# Patient Record
Sex: Male | Born: 1935 | Race: White | Hispanic: No | Marital: Married | Smoking: Former smoker
Health system: Southern US, Community
[De-identification: ages and names within clinical notes are randomized; demographics above are authoritative.]

## PROBLEM LIST (undated history)

## (undated) DIAGNOSIS — K219 Gastro-esophageal reflux disease without esophagitis: Secondary | ICD-10-CM

## (undated) DIAGNOSIS — I10 Essential (primary) hypertension: Secondary | ICD-10-CM

## (undated) DIAGNOSIS — K2270 Barrett's esophagus without dysplasia: Secondary | ICD-10-CM

## (undated) DIAGNOSIS — G459 Transient cerebral ischemic attack, unspecified: Secondary | ICD-10-CM

## (undated) DIAGNOSIS — E7800 Pure hypercholesterolemia, unspecified: Secondary | ICD-10-CM

## (undated) DIAGNOSIS — R079 Chest pain, unspecified: Secondary | ICD-10-CM

## (undated) DIAGNOSIS — R0989 Other specified symptoms and signs involving the circulatory and respiratory systems: Secondary | ICD-10-CM

## (undated) DIAGNOSIS — I5189 Other ill-defined heart diseases: Secondary | ICD-10-CM

## (undated) DIAGNOSIS — E785 Hyperlipidemia, unspecified: Secondary | ICD-10-CM

## (undated) DIAGNOSIS — I480 Paroxysmal atrial fibrillation: Secondary | ICD-10-CM

## (undated) DIAGNOSIS — R001 Bradycardia, unspecified: Secondary | ICD-10-CM

## (undated) DIAGNOSIS — I350 Nonrheumatic aortic (valve) stenosis: Secondary | ICD-10-CM

## (undated) DIAGNOSIS — I441 Atrioventricular block, second degree: Secondary | ICD-10-CM

## (undated) DIAGNOSIS — R319 Hematuria, unspecified: Secondary | ICD-10-CM

## (undated) HISTORY — PX: APPENDECTOMY: SHX54

## (undated) HISTORY — DX: Bradycardia, unspecified: R00.1

## (undated) HISTORY — DX: Hyperlipidemia, unspecified: E78.5

## (undated) HISTORY — DX: Other specified symptoms and signs involving the circulatory and respiratory systems: R09.89

## (undated) HISTORY — DX: Transient cerebral ischemic attack, unspecified: G45.9

## (undated) HISTORY — DX: Nonrheumatic aortic (valve) stenosis: I35.0

## (undated) HISTORY — PX: TONSILLECTOMY: SUR1361

## (undated) HISTORY — PX: COLONOSCOPY: SHX174

## (undated) HISTORY — DX: Atrioventricular block, second degree: I44.1

## (undated) HISTORY — DX: Hematuria, unspecified: R31.9

## (undated) HISTORY — PX: ESOPHAGOGASTRODUODENOSCOPY: SHX1529

## (undated) HISTORY — DX: Chest pain, unspecified: R07.9

## (undated) HISTORY — DX: Paroxysmal atrial fibrillation: I48.0

## (undated) HISTORY — DX: Other ill-defined heart diseases: I51.89

---

## 2008-12-05 ENCOUNTER — Ambulatory Visit: Payer: Self-pay | Admitting: Gastroenterology

## 2009-10-05 ENCOUNTER — Ambulatory Visit: Payer: Self-pay | Admitting: Gastroenterology

## 2009-10-09 ENCOUNTER — Ambulatory Visit: Payer: Self-pay | Admitting: Gastroenterology

## 2009-12-25 ENCOUNTER — Ambulatory Visit: Payer: Self-pay | Admitting: Gastroenterology

## 2011-12-22 ENCOUNTER — Emergency Department (HOSPITAL_COMMUNITY)
Admission: EM | Admit: 2011-12-22 | Discharge: 2011-12-22 | Disposition: A | Discharge status: Home / Self Care | Payer: PRIVATE HEALTH INSURANCE | Attending: Emergency Medicine | Admitting: Emergency Medicine

## 2011-12-22 ENCOUNTER — Encounter (HOSPITAL_COMMUNITY): Payer: Self-pay | Admitting: Family Medicine

## 2011-12-22 ENCOUNTER — Emergency Department (HOSPITAL_COMMUNITY): Payer: PRIVATE HEALTH INSURANCE

## 2011-12-22 DIAGNOSIS — Z7982 Long term (current) use of aspirin: Secondary | ICD-10-CM | POA: Insufficient documentation

## 2011-12-22 DIAGNOSIS — I10 Essential (primary) hypertension: Secondary | ICD-10-CM | POA: Insufficient documentation

## 2011-12-22 DIAGNOSIS — R55 Syncope and collapse: Secondary | ICD-10-CM

## 2011-12-22 DIAGNOSIS — E7800 Pure hypercholesterolemia, unspecified: Secondary | ICD-10-CM | POA: Insufficient documentation

## 2011-12-22 HISTORY — DX: Pure hypercholesterolemia, unspecified: E78.00

## 2011-12-22 HISTORY — DX: Essential (primary) hypertension: I10

## 2011-12-22 HISTORY — DX: Barrett's esophagus without dysplasia: K22.70

## 2011-12-22 LAB — BASIC METABOLIC PANEL
BUN: 18 mg/dL (ref 6–23)
Calcium: 9.9 mg/dL (ref 8.4–10.5)
Chloride: 99 mEq/L (ref 96–112)
Creatinine, Ser: 0.84 mg/dL (ref 0.50–1.35)
GFR calc Af Amer: >90 mL/min (ref >90)

## 2011-12-22 LAB — CBC WITH DIFFERENTIAL/PLATELET
Basophils Absolute: 0 10*3/uL (ref 0.0–0.1)
Basophils Relative: 0 % (ref 0–1)
Eosinophils Absolute: 0 10*3/uL (ref 0.0–0.7)
Eosinophils Relative: 0 % (ref 0–5)
HCT: 38.7 % — ABNORMAL LOW (ref 39.0–52.0)
Hemoglobin: 13.5 g/dL (ref 13.0–17.0)
Lymphocytes Relative: 8 % — ABNORMAL LOW (ref 12–46)
Lymphs Abs: 0.7 K/uL (ref 0.7–4.0)
MCH: 33.1 pg (ref 26.0–34.0)
MCHC: 34.9 g/dL (ref 30.0–36.0)
MCV: 94.9 fL (ref 78.0–100.0)
Monocytes Absolute: 0.5 10*3/uL (ref 0.1–1.0)
Monocytes Relative: 5 % (ref 3–12)
Neutro Abs: 7.4 10*3/uL (ref 1.7–7.7)
Neutrophils Relative %: 87 % — ABNORMAL HIGH (ref 43–77)
Platelets: 153 K/uL (ref 150–400)
RBC: 4.08 MIL/uL — ABNORMAL LOW (ref 4.22–5.81)
RDW: 12.7 % (ref 11.5–15.5)
WBC: 8.5 K/uL (ref 4.0–10.5)

## 2011-12-22 LAB — TROPONIN I: Troponin I: <0.3 ng/mL (ref <0.30)

## 2011-12-22 LAB — BASIC METABOLIC PANEL WITH GFR
CO2: 27 meq/L (ref 19–32)
GFR calc non Af Amer: 81 mL/min — ABNORMAL LOW (ref >90)
Glucose, Bld: 103 mg/dL — ABNORMAL HIGH (ref 70–99)
Potassium: 4.1 meq/L (ref 3.5–5.1)
Sodium: 135 meq/L (ref 135–145)

## 2011-12-22 MED ORDER — SODIUM CHLORIDE 0.9 % IV BOLUS (SEPSIS)
500.0000 mL | Freq: Once | INTRAVENOUS | Status: AC
  Administered 2011-12-22: 500 mL via INTRAVENOUS

## 2011-12-22 NOTE — ED Provider Notes (Signed)
History     CSN: [id number]  Arrival date & time 12/22/11  1246   First MD Initiated Contact with Patient 12/22/11 1259      Chief Complaint  Patient presents with  . Loss of Consciousness    (Consider location/radiation/quality/duration/timing/severity/associated sxs/prior treatment) HPI Comments: Pt was at church, had given a speech, admits stressors at home over the past few weeks, not sleeping well, didn't sleep till 0200 last night and got up early to get ready for duties at church.  During end of service, he stood, got lightheaded, warmth sensation in chest, started to feel wobbly in legs.  He then realized someone was shaking his shoulder and recalls people saying that they couldn't feel a pulse, they were going to use AED, but never occurred.  Spouse reports he collapsed, was half way on bench, was lowerd to ground, a Emergency planning/management officer there did actually perform chest compressions.  Pt admits some chest soreness now to left ribcage.  He feels slightly weak and slightly anxious over what occurred, but otherwise no back pain, no CP prior to event, no abd pain, did have 1 N/V en route which he attributes to vertigo like sensation while driving. No recent black stools, no N/V/D.  No cold symptoms, cough.  No new meds.  He developed allergies of some kind, had near anaphylaxis several months ago, had allergy testing, was put on zyrtec for it.  He reports he gets some transient waves of nausea and body tingling and numbness which resolves since this event occurred.    Patient is a 78 y.o. male presenting with syncope. The history is provided by the patient, the spouse and the EMS personnel.  Loss of Consciousness Pertinent negatives include no chest pain.    Past Medical History  Diagnosis Date  . Hypertension   . High cholesterol   . Barrett esophagus     History reviewed. No pertinent past surgical history.  History reviewed. No pertinent family history.  History  Substance Use  Topics  . Smoking status: Never Smoker   . Smokeless tobacco: Not on file  . Alcohol Use: No      Review of Systems  Constitutional: Negative for fever, chills and diaphoresis.  Cardiovascular: Positive for syncope. Negative for chest pain, palpitations and leg swelling.  Gastrointestinal: Positive for nausea and vomiting.  Genitourinary: Negative for flank pain.  Musculoskeletal: Negative for back pain.  Skin: Positive for color change. Negative for rash.  Neurological: Positive for dizziness, syncope, weakness, light-headedness and numbness.  All other systems reviewed and are negative.    Allergies  Review of patient's allergies indicates no known allergies.  Home Medications   Current Outpatient Rx  Name Route Sig Dispense Refill  . AMLODIPINE-ATORVASTATIN 5-20 MG PO TABS Oral Take 1 tablet by mouth daily.    . ASPIRIN EC 81 MG PO TBEC Oral Take 81 mg by mouth daily.    Marland Kitchen CETIRIZINE HCL 10 MG PO TABS Oral Take 10 mg by mouth daily.    Marland Kitchen LISINOPRIL-HYDROCHLOROTHIAZIDE 20-12.5 MG PO TABS Oral Take 1 tablet by mouth daily.    Marland Kitchen OMEPRAZOLE 20 MG PO CPDR Oral Take 20 mg by mouth daily.      BP 154/62  Pulse 65  Temp 97.5 F (36.4 C) (Oral)  Resp 20  SpO2 100%  Physical Exam  Nursing note and vitals reviewed. Constitutional: He is oriented to person, place, and time. He appears well-developed and well-nourished. No distress.  HENT:  Head: Normocephalic  and atraumatic.  Neck: Normal range of motion. Neck supple.  Cardiovascular: Normal rate.   No murmur heard. Pulmonary/Chest: Effort normal and breath sounds normal. No respiratory distress. He has no wheezes.  Abdominal: Soft. He exhibits no distension and no mass. There is no tenderness. There is no guarding.  Musculoskeletal: He exhibits no edema and no tenderness.  Neurological: He is alert and oriented to person, place, and time. Coordination normal.  Skin: Skin is warm and dry. No rash noted. He is not  diaphoretic. No pallor.  Psychiatric: He has a normal mood and affect.    ED Course  Procedures (including critical care time)  Labs Reviewed  CBC WITH DIFFERENTIAL - Abnormal; Notable for the following:    RBC 4.08 (*)     HCT 38.7 (*)     Neutrophils Relative 87 (*)     Lymphocytes Relative 8 (*)     All other components within normal limits  BASIC METABOLIC PANEL - Abnormal; Notable for the following:    Glucose, Bld 103 (*)     GFR calc non Af Amer 81 (*)     All other components within normal limits  TROPONIN I   Dg Chest 2 View  12/22/2011  *RADIOLOGY REPORT*  Clinical Data: Syncopal episode.  CHEST - 2 VIEW  Comparison: None.  Findings: The lungs are clear without edema, infiltrate, nodule or pleural effusion.  Heart size is normal.  Bony thorax shows mild and diffuse spondylosis of the thoracic spine as well as associated scoliosis and osteopenia.  IMPRESSION: No active disease.  Original Report Authenticated By: Reola Calkins, M.D.     1. Syncope     RA sat is 100% and I interpret to be normal.  4:17 PM Pt continues to feel well, no fracture of ribs seen on plain 2 view chest.  Pt is offered admission, discussed at length all issues.  He declines.  If he can ambulate, will d/c home and he can follow up with PCP in [place] at [facility].    MDM  Pt with syncope after brief prodrome of feeling light headed, but no CP, back pain.  Had 1 episode of vomiting.  Pt actually had CPR at scene, but unclear if he truly needed or not, I suspect not as he recalls hearing people talking about him at the scene.  He is almost entirely back to baseline here, normal neuro exam, ECG shows no arrythmia or ischemia, normal labs, troponin.          Gavin Pound. Oletta Lamas, MD 12/23/11 2303

## 2011-12-22 NOTE — ED Notes (Signed)
Per EMS, pt was at church and had a syncopal episode. No heart hx but pt showing 1st degree AV block. 1 episode of vomiting in route. Per bystanders When pt had syncopal episode initially he was pulseless. CPR initiated and pt became responsive. 324 ASA and 4mg  zofran given in route. IV left FA

## 2011-12-22 NOTE — Discharge Instructions (Signed)
 Syncope You have had a fainting (syncopal) spell. A fainting episode is a sudden, short-lived loss of consciousness. It results in complete recovery. It occurs because there has been a temporary shortage of oxygen and/or sugar (glucose) to the brain. CAUSES   Blood pressure pills and other medications that may lower blood pressure below normal. Sudden changes in posture (sudden standing).   Over-medication. Take your medications as directed.   Standing too long. This can cause blood to pool in the legs.   Seizure disorders.   Low blood sugar (hypoglycemia) of diabetes. This more commonly causes coma.   Bearing down to go to the bathroom. This can cause your blood pressure to rise suddenly. Your body compensates by making the blood pressure too low when you stop bearing down.   Hardening of the arteries where the brain temporarily does not receive enough blood.   Irregular heart beat and circulatory problems.   Fear, emotional distress, injury, sight of blood, or illness.  Your caregiver will send you home if the syncope was from non-worrisome causes (benign). Depending on your age and health, you may stay to be monitored and observed. If you return home, have someone stay with you if your caregiver feels that is desirable. It is very important to keep all follow-up referrals and appointments in order to properly manage this condition. This is a serious problem which can lead to serious illness and death if not carefully managed.  WARNING: Do not drive or operate machinery until your caregiver feels that it is safe for you to do so. SEEK IMMEDIATE MEDICAL CARE IF:   You have another fainting episode or faint while lying or sitting down. DO NOT DRIVE YOURSELF. Call 911 if no other help is available.   You have chest pain, are feeling sick to your stomach (nausea), vomiting or abdominal pain.   You have an irregular heartbeat or one that is very fast (pulse over 120 beats per minute).    You have a loss of feeling in some part of your body or lose movement in your arms or legs.   You have difficulty with speech, confusion, severe weakness, or visual problems.   You become sweaty and/or feel light headed.  Make sure you are rechecked as instructed. Document Released: 05/27/2005 Document Revised: 05/16/2011 Document Reviewed: 01/15/2007 [facility] Patient Information 2012 [place], [place].

## 2011-12-22 NOTE — ED Notes (Signed)
Pt ambulated in hall and tolerated fluids well.

## 2012-03-24 ENCOUNTER — Ambulatory Visit: Payer: Self-pay | Admitting: Gastroenterology

## 2013-11-23 DIAGNOSIS — K2270 Barrett's esophagus without dysplasia: Secondary | ICD-10-CM | POA: Diagnosis present

## 2013-12-07 ENCOUNTER — Ambulatory Visit: Payer: Self-pay | Admitting: Gastroenterology

## 2013-12-08 LAB — PATHOLOGY REPORT

## 2013-12-14 ENCOUNTER — Ambulatory Visit: Payer: Self-pay | Admitting: Gastroenterology

## 2013-12-17 LAB — PATHOLOGY REPORT

## 2014-04-05 DIAGNOSIS — I10 Essential (primary) hypertension: Secondary | ICD-10-CM | POA: Diagnosis present

## 2014-04-05 DIAGNOSIS — E7800 Pure hypercholesterolemia, unspecified: Secondary | ICD-10-CM | POA: Insufficient documentation

## 2017-07-17 ENCOUNTER — Ambulatory Visit: Payer: Medicare Other | Admitting: Anesthesiology

## 2017-07-17 ENCOUNTER — Encounter: Payer: Self-pay | Admitting: *Deleted

## 2017-07-17 ENCOUNTER — Encounter
Admission: RE | Disposition: A | Discharge status: Home / Self Care | Payer: Self-pay | Source: Ambulatory Visit | Attending: Gastroenterology

## 2017-07-17 ENCOUNTER — Ambulatory Visit
Admission: RE | Admit: 2017-07-17 | Discharge: 2017-07-17 | Disposition: A | Discharge status: Home / Self Care | Payer: Medicare Other | Source: Ambulatory Visit | Attending: Gastroenterology | Admitting: Gastroenterology

## 2017-07-17 DIAGNOSIS — K449 Diaphragmatic hernia without obstruction or gangrene: Secondary | ICD-10-CM | POA: Insufficient documentation

## 2017-07-17 DIAGNOSIS — Z7982 Long term (current) use of aspirin: Secondary | ICD-10-CM | POA: Insufficient documentation

## 2017-07-17 DIAGNOSIS — K2960 Other gastritis without bleeding: Secondary | ICD-10-CM | POA: Diagnosis not present

## 2017-07-17 DIAGNOSIS — K2270 Barrett's esophagus without dysplasia: Secondary | ICD-10-CM | POA: Insufficient documentation

## 2017-07-17 DIAGNOSIS — I10 Essential (primary) hypertension: Secondary | ICD-10-CM | POA: Diagnosis not present

## 2017-07-17 DIAGNOSIS — Z79899 Other long term (current) drug therapy: Secondary | ICD-10-CM | POA: Diagnosis not present

## 2017-07-17 DIAGNOSIS — E7800 Pure hypercholesterolemia, unspecified: Secondary | ICD-10-CM | POA: Insufficient documentation

## 2017-07-17 DIAGNOSIS — K219 Gastro-esophageal reflux disease without esophagitis: Secondary | ICD-10-CM | POA: Insufficient documentation

## 2017-07-17 HISTORY — PX: ESOPHAGOGASTRODUODENOSCOPY (EGD) WITH PROPOFOL: SHX5813

## 2017-07-17 HISTORY — DX: Gastro-esophageal reflux disease without esophagitis: K21.9

## 2017-07-17 SURGERY — ESOPHAGOGASTRODUODENOSCOPY (EGD) WITH PROPOFOL
Anesthesia: General

## 2017-07-17 MED ORDER — FENTANYL CITRATE (PF) 100 MCG/2ML IJ SOLN
INTRAMUSCULAR | Status: DC | PRN
  Administered 2017-07-17: 50 ug via INTRAVENOUS

## 2017-07-17 MED ORDER — FENTANYL CITRATE (PF) 100 MCG/2ML IJ SOLN
INTRAMUSCULAR | Status: AC
  Filled 2017-07-17: qty 2

## 2017-07-17 MED ORDER — LIDOCAINE HCL (PF) 2 % IJ SOLN
INTRAMUSCULAR | Status: AC
  Filled 2017-07-17: qty 10

## 2017-07-17 MED ORDER — PROPOFOL 10 MG/ML IV BOLUS
INTRAVENOUS | Status: AC
  Filled 2017-07-17: qty 20

## 2017-07-17 MED ORDER — PROPOFOL 10 MG/ML IV BOLUS
INTRAVENOUS | Status: DC | PRN
  Administered 2017-07-17: 30 mg via INTRAVENOUS
  Administered 2017-07-17: 70 mg via INTRAVENOUS

## 2017-07-17 MED ORDER — SODIUM CHLORIDE 0.9 % IV SOLN: INTRAVENOUS | Status: DC

## 2017-07-17 MED ORDER — MIDAZOLAM HCL 5 MG/5ML IJ SOLN
INTRAMUSCULAR | Status: DC | PRN
  Administered 2017-07-17: 1 mg via INTRAVENOUS

## 2017-07-17 MED ORDER — MIDAZOLAM HCL 2 MG/2ML IJ SOLN
INTRAMUSCULAR | Status: AC
  Filled 2017-07-17: qty 2

## 2017-07-17 MED ORDER — SODIUM CHLORIDE 0.9 % IV SOLN
INTRAVENOUS | Status: DC
  Administered 2017-07-17: 09:00:00 via INTRAVENOUS

## 2017-07-17 NOTE — Op Note (Signed)
[facility] Gastroenterology Patient Name: Carlos LericheClarence Frey Procedure Date: 07/17/2017 9:20 AM MRN: [id number] Account #: [id number] Date of Birth: Sep 01, 1929 Admit Type: Outpatient Age: 84 [facility] Gender: Male Note Status: Finalized Procedure:            Upper GI endoscopy Indications:          Surveillance procedure, Follow-up of Barrett's esophagus Providers:            Christena DeemMartin U. Skulskie, MD Referring MD:         Hassell HalimMarcus E. Babaoff MD (Referring MD) Medicines:            Monitored Anesthesia Care Complications:        No immediate complications. Procedure:            Pre-Anesthesia Assessment:                       - ASA Grade Assessment: II - A patient with mild                        systemic disease.                       After obtaining informed consent, the endoscope was                        passed under direct vision. Throughout the procedure,                        the patient's blood pressure, pulse, and oxygen                        saturations were monitored continuously. The Endoscope                        was introduced through the mouth, and advanced to the                        third part of duodenum. The upper GI endoscopy was                        accomplished without difficulty. The patient tolerated                        the procedure well. Findings:      Mucosal A-ring, intermittant, at the GE junction, widely patent.      Minimal evidence of Barretts esophagus present at the gastroesophageal       junction. The maximum longitudinal extent of these mucosal changes was       minimal evidence of Barretts endoscopically.      The exam of the esophagus was otherwise normal. No evidence of       esophagitis.      Diffuse mild inflammation characterized by erythema was found in the       gastric body. Biopsies were taken with a cold forceps for histology.      The cardia and gastric fundus were normal on retroflexion  otherwise.      A small hiatal hernia was present.      The examined duodenum was normal. Impression:           - Esophageal mucosal changes secondary to  established                        short-segment Barrett's disease.                       - Gastritis. Biopsied.                       - Small hiatal hernia.                       - Normal examined duodenum. Recommendation:       - Discharge patient to home.                       - Continue present medications.                       - Telephone GI clinic for pathology results in 1 week. Procedure Code(s):    --- Professional ---                       [id number], Esophagogastroduodenoscopy, flexible, transoral;                        with biopsy, single or multiple Diagnosis Code(s):    --- Professional ---                       K22.70, Barrett's esophagus without dysplasia                       K29.70, Gastritis, unspecified, without bleeding                       K44.9, Diaphragmatic hernia without obstruction or                        gangrene CPT copyright 2016 [employer]. All rights reserved. The codes documented in this report are preliminary and upon coder review may  be revised to meet current compliance requirements. Christena Deem, MD 07/17/2017 9:53:00 AM This report has been signed electronically. Number of Addenda: 0 Note Initiated On: 07/17/2017 9:20 AM      [facility]

## 2017-07-17 NOTE — Anesthesia Preprocedure Evaluation (Signed)
Anesthesia Evaluation  Patient identified by MRN, date of birth, ID band Patient awake    Reviewed: Allergy & Precautions, NPO status , Patient's Chart, lab work & pertinent test results  History of Anesthesia Complications Negative for: history of anesthetic complications  Airway Mallampati: II       Dental   Pulmonary neg sleep apnea, neg COPD, former smoker,           Cardiovascular hypertension, Pt. on medications (-) Past MI and (-) CHF (-) dysrhythmias + Valvular Problems/Murmurs (murmur, no tx)      Neuro/Psych neg Seizures    GI/Hepatic Neg liver ROS, GERD  Medicated and Controlled,  Endo/Other  neg diabetes  Renal/GU negative Renal ROS     Musculoskeletal   Abdominal   Peds  Hematology   Anesthesia Other Findings   Reproductive/Obstetrics                             Anesthesia Physical Anesthesia Plan  ASA: II  Anesthesia Plan: General   Post-op Pain Management:    Induction: Intravenous  PONV Risk Score and Plan: 2 and TIVA and Propofol infusion  Airway Management Planned: Nasal Cannula  Additional Equipment:   Intra-op Plan:   Post-operative Plan:   Informed Consent: I have reviewed the patients History and Physical, chart, labs and discussed the procedure including the risks, benefits and alternatives for the proposed anesthesia with the patient or authorized representative who has indicated his/her understanding and acceptance.     Plan Discussed with:   Anesthesia Plan Comments:         Anesthesia Quick Evaluation

## 2017-07-17 NOTE — Transfer of Care (Signed)
Immediate Anesthesia Transfer of Care Note  Patient: Carlos RoysClarence E Stall  Procedure(s) Performed: ESOPHAGOGASTRODUODENOSCOPY (EGD) WITH PROPOFOL (N/A )  Patient Location: PACU and Endoscopy Unit  Anesthesia Type:General  Level of Consciousness: sedated  Airway & Oxygen Therapy: Patient Spontanous Breathing  Post-op Assessment: Report given to RN and Post -op Vital signs reviewed and stable  Post vital signs: Reviewed and stable  Last Vitals:  Vitals:   07/17/17 0858  BP: 132/60  Pulse: 64  Resp: 18  Temp: (!) 36 C  SpO2: 98%    Last Pain:  Vitals:   07/17/17 0858  TempSrc: Tympanic         Complications: No apparent anesthesia complications

## 2017-07-17 NOTE — Anesthesia Postprocedure Evaluation (Signed)
Anesthesia Post Note  Patient: Carlos Frey  Procedure(s) Performed: ESOPHAGOGASTRODUODENOSCOPY (EGD) WITH PROPOFOL (N/A )  Patient location during evaluation: Endoscopy Anesthesia Type: General Level of consciousness: awake and alert Pain management: pain level controlled Vital Signs Assessment: post-procedure vital signs reviewed and stable Respiratory status: spontaneous breathing and respiratory function stable Cardiovascular status: stable Anesthetic complications: no     Last Vitals:  Vitals:   07/17/17 0858 07/17/17 0945  BP: 132/60 (!) 112/43  Pulse: 64 (!) 52  Resp: 18 19  Temp: (!) 36 C (!) 36.1 C  SpO2: 98% 96%    Last Pain:  Vitals:   07/17/17 0945  TempSrc: Tympanic                 KEPHART,WILLIAM K

## 2017-07-17 NOTE — Anesthesia Post-op Follow-up Note (Signed)
Anesthesia QCDR form completed.        

## 2017-07-17 NOTE — H&P (Addendum)
Outpatient short stay form Pre-procedure 07/17/2017 9:04 AM Carlos DeemMartin U Kaelee Pfeffer MD  Primary Physician: Dr Kandyce RudMarcus Babaoff  Reason for visit: EGD  History of present illness: Patient is a 84 year old male presenting today as above.  He has personal history of Barrett's esophagus without dysplasia.  He is taking a proton pump inhibitor daily.  He has no problems with heartburn or dysphagia.  He takes 81 mg aspirin daily.  He takes no other aspirin products or blood thinning agents.    Current Facility-Administered Medications:  .  0.9 %  sodium chloride infusion, , Intravenous, Continuous, Carlos DeemSkulskie, Barrie Sigmund U, MD  Medications Prior to Admission  Medication Sig Dispense Refill Last Dose  . amLODipine-atorvastatin (CADUET) 5-20 MG per tablet Take 1 tablet by mouth daily.   07/16/2017 at 2100  . aspirin EC 81 MG tablet Take 81 mg by mouth daily.   07/12/2017  . cetirizine (ZYRTEC) 10 MG tablet Take 10 mg by mouth daily.   07/16/2017 at 9:00  . lisinopril-hydrochlorothiazide (PRINZIDE,ZESTORETIC) 20-12.5 MG per tablet Take 1 tablet by mouth daily.   07/16/2017 at 0900  . omeprazole (PRILOSEC) 20 MG capsule Take 20 mg by mouth daily.   07/16/2017 at Unknown time     No Known Allergies   Past Medical History:  Diagnosis Date  . Barrett esophagus   . GERD (gastroesophageal reflux disease)   . High cholesterol   . Hypertension     Review of systems:      Physical Exam    Heart and lungs: Regular rate and rhythm without rub or gallop, there is a marked aortic murmur.  Bilaterally clear to auscultation    HEENT: Normocephalic atraumatic eyes are anicteric    Other:    Pertinant exam for procedure: Soft nontender nondistended bowel sounds positive normoactive. I have discussed the risks benefits and complications of procedures to include not limited to bleeding, infection, perforation and the risk of sedation and the patient wishes to proceed.    Planned proceedures: EGD and indicated  procedures. I have discussed the risks benefits and complications of procedures to include not limited to bleeding, infection, perforation and the risk of sedation and the patient wishes to proceed.    Carlos DeemMartin U Gavyn Ybarra, MD Gastroenterology 07/17/2017  9:04 AM   Addendum: Carlos Frey's discussion with patient and anesthesia in regards to the aortic murmur.  We will need to arrange for him to have a echocardiogram for further evaluation at least baseline evaluation, to help determine future risk.

## 2017-07-18 LAB — SURGICAL PATHOLOGY

## 2018-04-05 ENCOUNTER — Other Ambulatory Visit: Payer: Self-pay

## 2018-04-05 ENCOUNTER — Observation Stay
Admission: EM | Admit: 2018-04-05 | Discharge: 2018-04-07 | Disposition: A | Discharge status: Home / Self Care | Payer: Medicare Other | Attending: Internal Medicine | Admitting: Internal Medicine

## 2018-04-05 DIAGNOSIS — E876 Hypokalemia: Secondary | ICD-10-CM | POA: Insufficient documentation

## 2018-04-05 DIAGNOSIS — I440 Atrioventricular block, first degree: Secondary | ICD-10-CM

## 2018-04-05 DIAGNOSIS — Z7982 Long term (current) use of aspirin: Secondary | ICD-10-CM | POA: Insufficient documentation

## 2018-04-05 DIAGNOSIS — R112 Nausea with vomiting, unspecified: Secondary | ICD-10-CM | POA: Insufficient documentation

## 2018-04-05 DIAGNOSIS — Z87891 Personal history of nicotine dependence: Secondary | ICD-10-CM | POA: Insufficient documentation

## 2018-04-05 DIAGNOSIS — Z79899 Other long term (current) drug therapy: Secondary | ICD-10-CM | POA: Insufficient documentation

## 2018-04-05 DIAGNOSIS — I441 Atrioventricular block, second degree: Secondary | ICD-10-CM | POA: Diagnosis not present

## 2018-04-05 DIAGNOSIS — E7800 Pure hypercholesterolemia, unspecified: Secondary | ICD-10-CM | POA: Diagnosis not present

## 2018-04-05 DIAGNOSIS — K219 Gastro-esophageal reflux disease without esophagitis: Secondary | ICD-10-CM | POA: Diagnosis not present

## 2018-04-05 DIAGNOSIS — K2270 Barrett's esophagus without dysplasia: Secondary | ICD-10-CM | POA: Diagnosis not present

## 2018-04-05 DIAGNOSIS — R42 Dizziness and giddiness: Secondary | ICD-10-CM | POA: Insufficient documentation

## 2018-04-05 DIAGNOSIS — I10 Essential (primary) hypertension: Secondary | ICD-10-CM | POA: Diagnosis not present

## 2018-04-05 DIAGNOSIS — R55 Syncope and collapse: Principal | ICD-10-CM | POA: Diagnosis present

## 2018-04-05 DIAGNOSIS — E785 Hyperlipidemia, unspecified: Secondary | ICD-10-CM | POA: Insufficient documentation

## 2018-04-05 LAB — TROPONIN I: Troponin I: <0.03 ng/mL (ref <0.03)

## 2018-04-05 LAB — CBC WITH DIFFERENTIAL/PLATELET
ABS IMMATURE GRANULOCYTES: 0.02 10*3/uL (ref 0.00–0.07)
Basophils Absolute: 0 10*3/uL (ref 0.0–0.1)
Basophils Relative: 0 %
Eosinophils Absolute: 0.1 10*3/uL (ref 0.0–0.5)
Eosinophils Relative: 1 %
HCT: 39.4 % (ref 39.0–52.0)
HEMOGLOBIN: 13.3 g/dL (ref 13.0–17.0)
IMMATURE GRANULOCYTES: 0 %
LYMPHS PCT: 23 %
Lymphs Abs: 1.1 10*3/uL (ref 0.7–4.0)
MCH: 32.8 pg (ref 26.0–34.0)
MCHC: 33.8 g/dL (ref 30.0–36.0)
MCV: 97.3 fL (ref 80.0–100.0)
MONO ABS: 0.4 10*3/uL (ref 0.1–1.0)
MONOS PCT: 9 %
NEUTROS ABS: 3.1 10*3/uL (ref 1.7–7.7)
Neutrophils Relative %: 67 %
Platelets: 185 10*3/uL (ref 150–400)
RBC: 4.05 MIL/uL — ABNORMAL LOW (ref 4.22–5.81)
RDW: 13.2 % (ref 11.5–15.5)
WBC: 4.8 10*3/uL (ref 4.0–10.5)
nRBC: 0 % (ref 0.0–0.2)

## 2018-04-05 LAB — COMPREHENSIVE METABOLIC PANEL
ALK PHOS: 73 U/L (ref 38–126)
ALT: 16 U/L (ref 0–44)
AST: 27 U/L (ref 15–41)
Albumin: 3.8 g/dL (ref 3.5–5.0)
Anion gap: 14 (ref 5–15)
BUN: 14 mg/dL (ref 8–23)
CALCIUM: 9.2 mg/dL (ref 8.9–10.3)
CO2: 23 mmol/L (ref 22–32)
CREATININE: 0.83 mg/dL (ref 0.61–1.24)
Chloride: 102 mmol/L (ref 98–111)
GFR calc non Af Amer: >60 mL/min (ref >60)
Glucose, Bld: 166 mg/dL — ABNORMAL HIGH (ref 70–99)
Potassium: 3.6 mmol/L (ref 3.5–5.1)
SODIUM: 139 mmol/L (ref 135–145)
Total Bilirubin: 1 mg/dL (ref 0.3–1.2)
Total Protein: 7.5 g/dL (ref 6.5–8.1)

## 2018-04-05 LAB — LACTIC ACID, PLASMA
LACTIC ACID, VENOUS: 2.3 mmol/L — AB (ref 0.5–1.9)
LACTIC ACID, VENOUS: 2.3 mmol/L — AB (ref 0.5–1.9)
Lactic Acid, Venous: 2.1 mmol/L (ref 0.5–1.9)

## 2018-04-05 LAB — LIPASE, BLOOD: Lipase: 25 U/L (ref 11–51)

## 2018-04-05 LAB — MAGNESIUM: MAGNESIUM: 1.8 mg/dL (ref 1.7–2.4)

## 2018-04-05 MED ORDER — POLYETHYLENE GLYCOL 3350 17 G PO PACK: 17.0000 g | PACK | Freq: Every day | ORAL | Status: DC | PRN

## 2018-04-05 MED ORDER — SODIUM CHLORIDE 0.9 % IV BOLUS
500.0000 mL | Freq: Once | INTRAVENOUS | Status: AC
  Administered 2018-04-05: 500 mL via INTRAVENOUS

## 2018-04-05 MED ORDER — ACETAMINOPHEN 650 MG RE SUPP: 650.0000 mg | Freq: Four times a day (QID) | RECTAL | Status: DC | PRN

## 2018-04-05 MED ORDER — MONTELUKAST SODIUM 10 MG PO TABS
10.0000 mg | ORAL_TABLET | Freq: Every day | ORAL | Status: DC
  Administered 2018-04-05: 10 mg via ORAL
  Filled 2018-04-05: qty 1

## 2018-04-05 MED ORDER — MECLIZINE HCL 25 MG PO TABS
25.0000 mg | ORAL_TABLET | Freq: Once | ORAL | Status: AC
  Administered 2018-04-05: 25 mg via ORAL
  Filled 2018-04-05: qty 1

## 2018-04-05 MED ORDER — ENOXAPARIN SODIUM 40 MG/0.4ML ~~LOC~~ SOLN
40.0000 mg | SUBCUTANEOUS | Status: DC
  Administered 2018-04-05 – 2018-04-06 (×2): 40 mg via SUBCUTANEOUS
  Filled 2018-04-05 (×2): qty 0.4

## 2018-04-05 MED ORDER — FLUTICASONE PROPIONATE 50 MCG/ACT NA SUSP
2.0000 | Freq: Every day | NASAL | Status: DC
  Administered 2018-04-06: 2 via NASAL
  Filled 2018-04-05: qty 16

## 2018-04-05 MED ORDER — PANTOPRAZOLE SODIUM 40 MG PO TBEC
40.0000 mg | DELAYED_RELEASE_TABLET | Freq: Every day | ORAL | Status: DC
  Administered 2018-04-06 – 2018-04-07 (×2): 40 mg via ORAL
  Filled 2018-04-05 (×2): qty 1

## 2018-04-05 MED ORDER — ONDANSETRON HCL 4 MG/2ML IJ SOLN
4.0000 mg | Freq: Once | INTRAMUSCULAR | Status: AC
  Administered 2018-04-05: 4 mg via INTRAVENOUS
  Filled 2018-04-05: qty 2

## 2018-04-05 MED ORDER — LISINOPRIL 20 MG PO TABS
20.0000 mg | ORAL_TABLET | Freq: Every day | ORAL | Status: DC
  Administered 2018-04-05 – 2018-04-07 (×3): 20 mg via ORAL
  Filled 2018-04-05: qty 2
  Filled 2018-04-05 (×2): qty 1

## 2018-04-05 MED ORDER — AMLODIPINE BESYLATE 5 MG PO TABS
5.0000 mg | ORAL_TABLET | Freq: Every day | ORAL | Status: DC
  Administered 2018-04-06 – 2018-04-07 (×2): 5 mg via ORAL
  Filled 2018-04-05 (×2): qty 1

## 2018-04-05 MED ORDER — ASPIRIN EC 81 MG PO TBEC
81.0000 mg | DELAYED_RELEASE_TABLET | Freq: Every day | ORAL | Status: DC
  Administered 2018-04-06 – 2018-04-07 (×2): 81 mg via ORAL
  Filled 2018-04-05 (×2): qty 1

## 2018-04-05 MED ORDER — ATORVASTATIN CALCIUM 20 MG PO TABS
20.0000 mg | ORAL_TABLET | Freq: Every day | ORAL | Status: DC
  Administered 2018-04-06 – 2018-04-07 (×2): 20 mg via ORAL
  Filled 2018-04-05 (×2): qty 1

## 2018-04-05 MED ORDER — ONDANSETRON HCL 4 MG PO TABS: 4.0000 mg | ORAL_TABLET | Freq: Four times a day (QID) | ORAL | Status: DC | PRN

## 2018-04-05 MED ORDER — ONDANSETRON HCL 4 MG/2ML IJ SOLN
4.0000 mg | Freq: Four times a day (QID) | INTRAMUSCULAR | Status: DC | PRN
  Administered 2018-04-05: 4 mg via INTRAVENOUS

## 2018-04-05 MED ORDER — ACETAMINOPHEN 325 MG PO TABS: 650.0000 mg | ORAL_TABLET | Freq: Four times a day (QID) | ORAL | Status: DC | PRN

## 2018-04-05 MED ORDER — AMLODIPINE-ATORVASTATIN 5-20 MG PO TABS: 1.0000 | ORAL_TABLET | Freq: Every day | ORAL | Status: DC

## 2018-04-05 MED ORDER — LORATADINE 10 MG PO TABS
10.0000 mg | ORAL_TABLET | Freq: Every day | ORAL | Status: DC
  Administered 2018-04-06 – 2018-04-07 (×2): 10 mg via ORAL
  Filled 2018-04-05 (×2): qty 1

## 2018-04-05 NOTE — ED Notes (Signed)
Report to Susan, RN  

## 2018-04-05 NOTE — ED Provider Notes (Signed)
[facility] Emergency Department Provider Note ____________________________________________   First MD Initiated Contact with Patient 04/05/18 1111     (approximate)  I have reviewed the triage vital signs and the nursing notes.   HISTORY  Chief Complaint Dizziness and Emesis    HPI Carlos Frey is a 84 y.o. 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  The patient states that he still feels lightheaded and nauseous although when he puts his head down it is slightly better.  He denies any spinning component.  He denies chest pain, shortness of breath, or palpitations.  He reports one prior syncopal episode a few years ago but no episodes similar to today.  Past Medical History:  Diagnosis Date  . Barrett esophagus   . GERD (gastroesophageal reflux disease)   . High cholesterol   . Hypertension     There are no active problems to display for this patient.   Past Surgical History:  Procedure Laterality Date  . APPENDECTOMY    . COLONOSCOPY  08/08/03, 12/05/08 & 12/14/13  . ESOPHAGOGASTRODUODENOSCOPY  10/09/09, 12/25/09 & 03/24/12  . ESOPHAGOGASTRODUODENOSCOPY (EGD) WITH PROPOFOL N/A 07/17/2017   Procedure: ESOPHAGOGASTRODUODENOSCOPY (EGD) WITH PROPOFOL;  Surgeon: Christena Deem, MD;  Location: [facility] ENDOSCOPY;  Service: Endoscopy;  Laterality: N/A;  . TONSILLECTOMY      Prior to Admission medications   Medication Sig Start Date End Date Taking? Authorizing Provider  amLODipine-atorvastatin (CADUET) 5-20 MG per tablet Take 1 tablet by mouth daily.   Yes [provider]  aspirin EC 81 MG tablet Take 81 mg by mouth daily.   Yes [provider]  cetirizine (ZYRTEC) 10 MG tablet Take 10 mg by mouth daily.   Yes [provider]  fluticasone (FLONASE) 50 MCG/ACT  nasal spray Place 2 sprays into both nostrils daily. 04/03/18  Yes [provider]  lisinopril-hydrochlorothiazide (PRINZIDE,ZESTORETIC) 20-12.5 MG per tablet Take 1 tablet by mouth daily.   Yes [provider]  montelukast (SINGULAIR) 10 MG tablet Take 10 mg by mouth at bedtime. 04/03/18  Yes [provider]  omeprazole (PRILOSEC) 20 MG capsule Take 20 mg by mouth daily.   Yes [provider]    Allergies Patient has no known allergies.  No family history on file.  Social History Social History   Tobacco Use  . Smoking status: Former Smoker    Types: Pipe, Cigars    Last attempt to quit: 1965    Years since quitting: 54.8  . Smokeless tobacco: Never Used  Substance Use Topics  . Alcohol use: No  . Drug use: No    Review of Systems  Constitutional: No fever. Eyes: No visual changes. ENT: No sore throat. Cardiovascular: Denies chest pain. Respiratory: Denies shortness of breath. Gastrointestinal: Positive for nausea and vomiting.  Genitourinary: Negative for flank pain.  Musculoskeletal: Negative for back pain. Skin: Negative for rash. Neurological: Negative for headaches, focal weakness or numbness.   ____________________________________________   PHYSICAL EXAM:  VITAL SIGNS: ED Triage Vitals [04/05/18 1036]  Enc Vitals Group     BP (!) 171/58     Pulse Rate 71     Resp 18     Temp 98.1 F (36.7 C)     Temp Source Oral     SpO2 99 %     Weight  187 lb (84.8 kg)     Height 5\' 11"  (1.803 m)     Head Circumference      Peak Flow      Pain Score 0     Pain Loc      Pain Edu?      Excl. in GC?     Constitutional: Alert and oriented.  Slightly uncomfortable appearing but in no acute distress. Eyes: Conjunctivae are normal.  EOMI.  PERRLA.  No nystagmus. Head: Atraumatic. Nose: No congestion/rhinnorhea. Mouth/Throat: Mucous membranes are slightly dry.   Neck: Normal range of motion.  Cardiovascular: Normal rate,  irregular rhythm. Grossly normal heart sounds.  Good peripheral circulation. Respiratory: Normal respiratory effort.  No retractions. Lungs CTAB. Gastrointestinal: Soft and nontender. No distention.  Genitourinary: No flank tenderness. Musculoskeletal: No lower extremity edema.  Extremities warm and well perfused.  Neurologic:  Normal speech and language.  Normal coordination.  Motor intact in all extremities.  No gross focal neurologic deficits are appreciated.  Skin:  Skin is warm and dry. No rash noted. Psychiatric: Mood and affect are normal. Speech and behavior are normal.  ____________________________________________   LABS (all labs ordered are listed, but only abnormal results are displayed)  Labs Reviewed  CBC WITH DIFFERENTIAL/PLATELET - Abnormal; Notable for the following components:      Result Value   RBC 4.05 (*)    All other components within normal limits  COMPREHENSIVE METABOLIC PANEL - Abnormal; Notable for the following components:   Glucose, Bld 166 (*)    All other components within normal limits  TROPONIN I  LIPASE, BLOOD  LACTIC ACID, PLASMA  LACTIC ACID, PLASMA  URINALYSIS, COMPLETE (UACMP) WITH MICROSCOPIC   ____________________________________________  EKG  ED ECG REPORT I, Dionne Bucy, the attending physician, personally viewed and interpreted this ECG.  Date: 04/05/2018 EKG Time: 1022 Rate: 73 Rhythm: Sinus rhythm with prolonged PR and PACs QRS Axis: normal Intervals: normal ST/T Wave abnormalities: normal Narrative Interpretation: no evidence of acute ischemia  ____________________________________________  RADIOLOGY    ____________________________________________   PROCEDURES  Procedure(s) performed: No  Procedures  Critical Care performed: No ____________________________________________   INITIAL IMPRESSION / ASSESSMENT AND PLAN / ED COURSE  Pertinent labs & imaging results that were available during my care of the  patient were reviewed by me and considered in my medical decision making (see chart for details).  84 year old male with PMH as noted above but no significant prior cardiac history presents with an episode of near syncope with nausea and vomiting.  The dizziness is somewhat positional.  On exam his vital signs are normal except for hypertension.  The remainder of the exam is as described above.  Neuro exam is nonfocal.  The EKG shows significantly prolonged PR interval and what appeared to be PACs with pauses.  It does not appear to be a complete heart block and there are no dropped beats although he has had some longer sinus pauses on the monitor.  I have no prior EKG available for comparison.  Overall the patient's presentation may very well be a vasovagal near syncope especially as he states he was in a hurry this morning and did not eat, and then was standing for a prolonged prior.  Differential also includes peripheral vertigo since it is somewhat positional although I think this is less likely.  However given the abnormal EKG and the patient's age, it could also be a cardiac etiology.  We will obtain labs, give fluids and symptomatic medications, and  reassess.  I will discuss with cardiology but consider admission for further observation.  ----------------------------------------- 1:38 PM on 04/05/2018 -----------------------------------------  Lab work-up is unremarkable.  The patient is feeling somewhat better after nausea medication and fluids.  I consulted Dr. and from cardiology about the rhythm on the EKG.  He agrees to come evaluate the patient also agrees with admission for monitoring.  I signed the patient out to the hospitalist Dr. Allena Katz. ____________________________________________   FINAL CLINICAL IMPRESSION(S) / ED DIAGNOSES  Final diagnoses:  Near syncope  1st degree AV block      NEW MEDICATIONS STARTED DURING THIS VISIT:  New Prescriptions   No medications on file      Note:  This document was prepared using Dragon voice recognition software and may include unintentional dictation errors.    Dionne Bucy, MD 04/05/18 [phone]

## 2018-04-05 NOTE — Consult Note (Signed)
Cardiology Consultation:   Patient ID: Carlos Frey MRN: [id number]; DOB: Mar 01, 1930  Admit date: 04/05/2018 Date of Consult: 04/05/2018  Primary Care Provider: [name], MD Primary Cardiologist: New - Consult by Krisinda Giovanni Primary Electrophysiologist:  None    Patient Profile:   Carlos Frey is a 84 y.o. male with a hx of hypertension, GERD, and Barrett's esophagus who is being seen today for the evaluation of abnormal EKG and near syncope at the request of Dr. Marisa Severin.  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  He was standing and notes giving a particularly long lesson, during which she began to feel woozy and off balance.  He was able to sit down and ultimately placed his head between his legs for several minutes.  He felt very lightheaded but never passed out.  He subsequently became acutely nauseated and vomited.  He was able to transfer to a wheelchair and was brought by his family to the emergency department for further evaluation.  Here, he has continued to feel lightheaded and off-balance when standing.  He has not had any further nausea or vomiting.  Carlos Frey reports a history of a heart murmur but otherwise no cardiac disease.  He has not had any chest pain, shortness of breath, palpitations, orthopnea, PND, or edema.  He is typically quite active and is able to walk and climb stairs without any limitations.  He has been compliant with his medications.  He notes having dental problems following removal of a left maxillary molar in September.  He subsequently developed sinusitis for which he was treated with antibiotics and allergy medications.  He had skipped breakfast this morning, though he drank water and orange juice before leaving the house.  He denies fever and chills, though he notes that his grandson, who lives in an apartment on his property, recently had  nausea and vomiting.  Past Medical History:  Diagnosis Date  . Barrett esophagus   . GERD (gastroesophageal reflux disease)   . High cholesterol   . Hypertension     Past Surgical History:  Procedure Laterality Date  . APPENDECTOMY    . COLONOSCOPY  08/08/03, 12/05/08 & 12/14/13  . ESOPHAGOGASTRODUODENOSCOPY  10/09/09, 12/25/09 & 03/24/12  . ESOPHAGOGASTRODUODENOSCOPY (EGD) WITH PROPOFOL N/A 07/17/2017   Procedure: ESOPHAGOGASTRODUODENOSCOPY (EGD) WITH PROPOFOL;  Surgeon: Christena Deem, MD;  Location: [facility] ENDOSCOPY;  Service: Endoscopy;  Laterality: N/A;  . TONSILLECTOMY       Home Medications:  Prior to Admission medications   Medication Sig Start Date Nafeesa Dils Date Taking? Authorizing Provider  amLODipine-atorvastatin (CADUET) 5-20 MG per tablet Take 1 tablet by mouth daily.   Yes [provider]  aspirin EC 81 MG tablet Take 81 mg by mouth daily.   Yes [provider]  cetirizine (ZYRTEC) 10 MG tablet Take 10 mg by mouth daily.   Yes [provider]  fluticasone (FLONASE) 50 MCG/ACT nasal spray Place 2 sprays into both nostrils daily. 04/03/18  Yes [provider]  lisinopril-hydrochlorothiazide (PRINZIDE,ZESTORETIC) 20-12.5 MG per tablet Take 1 tablet by mouth daily.   Yes [provider]  montelukast (SINGULAIR) 10 MG tablet Take 10 mg by mouth at bedtime. 04/03/18  Yes [provider]  omeprazole (PRILOSEC) 20 MG capsule Take 20 mg by mouth daily.   Yes [provider]    Inpatient Medications: Scheduled Meds:  Continuous Infusions: . sodium chloride  PRN Meds:   Allergies:   No Known Allergies  Social History:   Social History   Socioeconomic History  . Marital status: Married    Spouse name: Not on file  . Number of children: Not on file  . Years of education: Not on file  . Highest education level: Not on file  Occupational History  . Not on file  Social Needs  . Financial resource strain: Not on  file  . Food insecurity:    Worry: Not on file    Inability: Not on file  . Transportation needs:    Medical: Not on file    Non-medical: Not on file  Tobacco Use  . Smoking status: Former Smoker    Types: Pipe, Cigars    Last attempt to quit: 1965    Years since quitting: 54.8  . Smokeless tobacco: Never Used  Substance and Sexual Activity  . Alcohol use: No  . Drug use: No  . Sexual activity: Not on file  Lifestyle  . Physical activity:    Days per week: Not on file    Minutes per session: Not on file  . Stress: Not on file  Relationships  . Social connections:    Talks on phone: Not on file    Gets together: Not on file    Attends religious service: Not on file    Active member of club or organization: Not on file    Attends meetings of clubs or organizations: Not on file    Relationship status: Not on file  . Intimate partner violence:    Fear of current or ex partner: Not on file    Emotionally abused: Not on file    Physically abused: Not on file    Forced sexual activity: Not on file  Other Topics Concern  . Not on file  Social History Narrative  . Not on file    Family History:   Negative for heart disease and syncope.  ROS:  Please see the history of present illness. All other ROS reviewed and negative.     Physical Exam/Data:   Vitals:   04/05/18 1036  BP: (!) 171/58  Pulse: 71  Resp: 18  Temp: 98.1 F (36.7 C)  TempSrc: Oral  SpO2: 99%  Weight: 84.8 kg  Height: 5\' 11"  (1.803 m)   No intake or output data in the 24 hours ending 04/05/18 1316 Filed Weights   04/05/18 1036  Weight: 84.8 kg   Body mass index is 26.08 kg/m.  General:  Well nourished, well developed, in no acute distress. HEENT: normal Lymph: no adenopathy Neck: no JVD or HJR Endocrine:  No thryomegaly Vascular: No carotid bruits; 2+ radial and pedal pulses bilaterally. Cardiac: Regular rate and rhythm with occasional extrasystoles.  2/6 crescendo-decrescendo murmur heard  loudest at the right upper sternal border radiating to the right carotid artery.  No rubs or gallops. Lungs:  clear to auscultation bilaterally, no wheezing, rhonchi or rales  Abd: soft, nontender, no hepatomegaly  Ext: no edema Musculoskeletal:  No deformities, BUE and BLE strength normal and equal Skin: warm and dry  Neuro:  CNs 3-12 mostly intact, no focal abnormalities noted Psych:  Normal affect   EKG:  The EKG was personally reviewed and demonstrates: Baseline artifact, which limits evaluation.  I favor this representing sinus rhythm with Mobitz type I second-degree AV block and PACs.  No significant ST segment or T wave abnormalities are identified. Telemetry:  Telemetry was personally reviewed and  demonstrates: Sinus rhythm with first-degree AV block.  Occasional pauses are most consistent with Wenkebach, though sinus arrest with ectopic atrial beat is a consideration.  Relevant CV Studies: Echocardiogram (08/06/2017 [facility]): Normal LV size with mild LVH.  LVEF greater than 55%.  Normal RV size and function.  Moderate left atrial enlargement.  Mild aortic stenosis and regurgitation.  Mild to moderate mitral regurgitation.  Mild tricuspid regurgitation.  Laboratory Data:  Chemistry Recent Labs  Lab 04/05/18 1049  NA 139  K 3.6  CL 102  CO2 23  GLUCOSE 166*  BUN 14  CREATININE 0.83  CALCIUM 9.2  GFRNONAA >60  GFRAA >60  ANIONGAP 14    Recent Labs  Lab 04/05/18 1049  PROT 7.5  ALBUMIN 3.8  AST 27  ALT 16  ALKPHOS 73  BILITOT 1.0   Hematology Recent Labs  Lab 04/05/18 1049  WBC 4.8  RBC 4.05*  HGB 13.3  HCT 39.4  MCV 97.3  MCH 32.8  MCHC 33.8  RDW 13.2  PLT 185   Cardiac Enzymes Recent Labs  Lab 04/05/18 1049  TROPONINI <0.03   No results for input(s): TROPIPOC in the last 168 hours.  BNPNo results for input(s): BNP, PROBNP in the last 168 hours.  DDimer No results for input(s): DDIMER in the last 168 hours.  Radiology/Studies:  No  results found.  Assessment and Plan:   Near syncope Symptoms are concerning for iliac because.  Arrhythmia is my chief concern, given finding of first-degree AV block and occasional pauses that are most consistent with Wenckebach, though sinus pauses are consideration.  It is possible that Carlos Frey has increased vagal tone in the setting of a GI illness, though lightheadedness seem to precede his nausea and emesis.  Additionally, a systolic murmur is appreciated on exam, consistent with previously described aortic stenosis.  Mitral and tricuspid regurgitation were also previously observed.  It is possible that valvular heart disease could have been underestimated or has progressed since his echo in February.  Admit to hospitalist service for observation on telemetry.  Obtain transthoracic echocardiogram.  IV hydration.  Avoid AV nodal blocking agents (beta-blockers, non-dihydropyridine calcium channel blockers, and digoxin).  Check orthostatic vital signs.  Hypertension Blood pressure moderately elevated at this time.  Check orthostatic vital signs.  If normal, I think it would be reasonable to continue lisinopril and amlodipine for elevated blood pressure.  I would hold HCTZ for the time being to minimize the risk for dehydration.  For questions or updates, please contact [facility] Please consult [email or web address] for contact info under [facility].  Signed, Yvonne Kendall, MD  04/05/2018 1:16 PM

## 2018-04-05 NOTE — ED Triage Notes (Signed)
Pt states he was standing at church around 915am and had sudden onset dizziness with cool clammy skin with N/V. Pt actively vomiting on arrival. Denies any pain.

## 2018-04-05 NOTE — H&P (Signed)
[facility] - [place] at [facility]   PATIENT NAME: Carlos Frey    MR#:  [id number]  DATE OF BIRTH:  11-30-1929  DATE OF ADMISSION:  04/05/2018  PRIMARY CARE PHYSICIAN: [name], MD   REQUESTING/REFERRING PHYSICIAN: Dr. Marisa Severin  CHIEF COMPLAINT:  feeling dizzy and lightheaded and nauseous  HISTORY OF PRESENT ILLNESS:  Carlos Frey  is a 84 y.o. 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 He sat down and slumped over without losing consciousness. He started feeling nauseous and had episode of vomiting couple times.  Patient came to the emergency room blood pressure was elevated however his heart rate varied anywhere from 40s to 70s. EKG showed prolonged PRN trouble with PACs and sinus arrest.  Patient is being admitted for further evaluation of management. He was seen by cardiology Dr. END the ER.    PAST MEDICAL HISTORY:   Past Medical History:  Diagnosis Date  . Barrett esophagus   . GERD (gastroesophageal reflux disease)   . High cholesterol   . Hypertension     PAST SURGICAL HISTOIRY:   Past Surgical History:  Procedure Laterality Date  . APPENDECTOMY    . COLONOSCOPY  08/08/03, 12/05/08 & 12/14/13  . ESOPHAGOGASTRODUODENOSCOPY  10/09/09, 12/25/09 & 03/24/12  . ESOPHAGOGASTRODUODENOSCOPY (EGD) WITH PROPOFOL N/A 07/17/2017   Procedure: ESOPHAGOGASTRODUODENOSCOPY (EGD) WITH PROPOFOL;  Surgeon: Christena Deem, MD;  Location: [facility] ENDOSCOPY;  Service: Endoscopy;  Laterality: N/A;  . TONSILLECTOMY      SOCIAL HISTORY:   Social History   Tobacco Use  . Smoking status: Former Smoker    Types: Pipe, Cigars    Last attempt to quit: 1965    Years since quitting:  54.8  . Smokeless tobacco: Never Used  Substance Use Topics  . Alcohol use: No    FAMILY HISTORY:  No family history on file.  DRUG ALLERGIES:  No Known Allergies  REVIEW OF SYSTEMS:  Review of Systems  Constitutional: Negative for chills, fever and weight loss.  HENT: Negative for ear discharge, ear pain and nosebleeds.   Eyes: Negative for blurred vision, pain and discharge.  Respiratory: Negative for sputum production, shortness of breath, wheezing and stridor.   Cardiovascular: Negative for chest pain, palpitations, orthopnea and PND.  Gastrointestinal: Positive for nausea. Negative for abdominal pain, diarrhea and vomiting.  Genitourinary: Negative for frequency and urgency.  Musculoskeletal: Negative for back pain and joint pain.  Neurological: Positive for dizziness and weakness. Negative for sensory change, speech change and focal weakness.  Psychiatric/Behavioral: Negative for depression and hallucinations. The patient is not nervous/anxious.      MEDICATIONS AT HOME:   Prior to Admission medications   Medication Sig Start Date End Date Taking? Authorizing Provider  amLODipine-atorvastatin (CADUET) 5-20 MG per tablet Take 1 tablet by mouth daily.   Yes [provider]  aspirin EC 81 MG tablet Take 81 mg by mouth daily.   Yes [provider]  cetirizine (ZYRTEC) 10 MG tablet Take 10 mg by mouth daily.   Yes [provider]  fluticasone (FLONASE) 50 MCG/ACT nasal spray Place 2 sprays into both nostrils daily. 04/03/18  Yes [provider]  lisinopril-hydrochlorothiazide (PRINZIDE,ZESTORETIC) 20-12.5 MG per tablet Take 1 tablet by mouth  daily.   Yes [provider]  montelukast (SINGULAIR) 10 MG tablet Take 10 mg by mouth at bedtime. 04/03/18  Yes [provider]  omeprazole (PRILOSEC) 20 MG capsule Take 20 mg by mouth daily.   Yes [provider]      VITAL SIGNS:  Blood pressure (!) 171/58, pulse 71,  temperature 98.1 F (36.7 C), temperature source Oral, resp. rate 18, height 5\' 11"  (1.803 m), weight 84.8 kg, SpO2 99 %.  PHYSICAL EXAMINATION:  GENERAL:  84 y.o.-year-old patient lying in the bed with no acute distress.  EYES: Pupils equal, round, reactive to light and accommodation. No scleral icterus. Extraocular muscles intact.  HEENT: Head atraumatic, normocephalic. Oropharynx and nasopharynx clear.  NECK:  Supple, no jugular venous distention. No thyroid enlargement, no tenderness.  LUNGS: Normal breath sounds bilaterally, no wheezing, rales,rhonchi or crepitation. No use of accessory muscles of respiration.  CARDIOVASCULAR: S1, S2 normal. No murmurs, rubs, or gallops.  ABDOMEN: Soft, nontender, nondistended. Bowel sounds present. No organomegaly or mass.  EXTREMITIES: No pedal edema, cyanosis, or clubbing.  NEUROLOGIC: Cranial nerves II through XII are intact. Muscle strength 5/5 in all extremities. Sensation intact. Gait not checked.  PSYCHIATRIC: The patient is alert and oriented x 3.  SKIN: No obvious rash, lesion, or ulcer.   LABORATORY PANEL:   CBC Recent Labs  Lab 04/05/18 1049  WBC 4.8  HGB 13.3  HCT 39.4  PLT 185   ------------------------------------------------------------------------------------------------------------------  Chemistries  Recent Labs  Lab 04/05/18 1049  NA 139  K 3.6  CL 102  CO2 23  GLUCOSE 166*  BUN 14  CREATININE 0.83  CALCIUM 9.2  MG 1.8  AST 27  ALT 16  ALKPHOS 73  BILITOT 1.0   ------------------------------------------------------------------------------------------------------------------  Cardiac Enzymes Recent Labs  Lab 04/05/18 1049  TROPONINI <0.03   ------------------------------------------------------------------------------------------------------------------  RADIOLOGY:  No results found.  EKG:  sinus rhythm with Mobitz type I second-degree AV block and PACs.  No significant ST segment or T wave  abnormalities   IMPRESSION AND PLAN:   Carlos Frey  is a 84 y.o. 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 He sat down and slumped over without losing consciousness. He started feeling nauseous and had episode of vomiting couple times.  1. near syncope with possible arrhythmia, 1st AV block and occasional pauses consistent with sinus pauses -cardiology consultation appreciated -echo of the heart -patient is not on any AV nodal blocking agents -blood pressure stable  2. hypertension continue home meds with amlodipine and lisinopril -hold hydrochlorothiazide  3. [name] continue PPI  4. DVT prophylaxis subcu Lovenox  5. Nausea/dizziness PRN meclizine -consider MRI of the brain if symptoms of dizziness do not improve rule out CVA      All the records are reviewed and case discussed with ED provider. Management plans discussed with the patient, family and they are in agreement.  CODE STATUS: full  TOTAL TIME TAKING CARE OF THIS PATIENT: *50* minutes.    Enedina Finner M.D on 04/05/2018 at 2:24 PM  Between 7am to 6pm - Pager - [phone]  After 6pm go to [email or web address] - password [email or web address] [facility]  [facility]  Office  [phone]  CC: Primary care physician; [name], MD

## 2018-04-05 NOTE — Progress Notes (Signed)
Family Meeting Note  Patient came in with near syncopal episode along with nausea and vomiting. Was found to have some sinus pauses on EKG with PACs. Patient is being admitted for overnight observation. Discuss code status patient wants to be full code. He is otherwise quite functional. No family in the emergency room.  Time spent 16 minutes

## 2018-04-06 ENCOUNTER — Other Ambulatory Visit: Payer: Self-pay

## 2018-04-06 ENCOUNTER — Observation Stay: Payer: Medicare Other

## 2018-04-06 ENCOUNTER — Observation Stay (HOSPITAL_BASED_OUTPATIENT_CLINIC_OR_DEPARTMENT_OTHER)
Admit: 2018-04-06 | Discharge: 2018-04-06 | Disposition: A | Discharge status: Home / Self Care | Payer: Medicare Other | Attending: Internal Medicine | Admitting: Internal Medicine

## 2018-04-06 DIAGNOSIS — I440 Atrioventricular block, first degree: Secondary | ICD-10-CM

## 2018-04-06 DIAGNOSIS — I351 Nonrheumatic aortic (valve) insufficiency: Secondary | ICD-10-CM

## 2018-04-06 DIAGNOSIS — R55 Syncope and collapse: Secondary | ICD-10-CM | POA: Diagnosis not present

## 2018-04-06 LAB — BASIC METABOLIC PANEL
Anion gap: 7 (ref 5–15)
BUN: 12 mg/dL (ref 8–23)
CALCIUM: 8.9 mg/dL (ref 8.9–10.3)
CO2: 27 mmol/L (ref 22–32)
CREATININE: 0.68 mg/dL (ref 0.61–1.24)
Chloride: 106 mmol/L (ref 98–111)
GFR calc Af Amer: >60 mL/min (ref >60)
Glucose, Bld: 105 mg/dL — ABNORMAL HIGH (ref 70–99)
POTASSIUM: 3.7 mmol/L (ref 3.5–5.1)
SODIUM: 140 mmol/L (ref 135–145)

## 2018-04-06 LAB — TROPONIN I

## 2018-04-06 LAB — TSH: TSH: 0.518 u[IU]/mL (ref 0.350–4.500)

## 2018-04-06 NOTE — Care Management Important Message (Signed)
Important Message  Patient Details  Name: Carlos Frey MRN: [id number] Date of Birth: 1929/12/19   Medicare Important Message Given:  Yes    Sherren Kerns, RN 04/06/2018, 1:47 PM

## 2018-04-06 NOTE — Progress Notes (Signed)
[facility] - [place] at [facility]   PATIENT NAME: Carlos Frey    MR#:  [id number]  DATE OF BIRTH:  1930-06-02  SUBJECTIVE:  CHIEF COMPLAINT:   Chief Complaint  Patient presents with  . Dizziness  . Emesis  feels somewhat better but still feels some dizzy when he moves, more worried about some complication of tooth pulled recently REVIEW OF SYSTEMS:  Review of Systems  Constitutional: Negative for chills, fever and weight loss.  HENT: Negative for nosebleeds and sore throat.   Eyes: Negative for blurred vision.  Respiratory: Negative for cough, shortness of breath and wheezing.   Cardiovascular: Negative for chest pain, orthopnea, leg swelling and PND.  Gastrointestinal: Negative for abdominal pain, constipation, diarrhea, heartburn, nausea and vomiting.  Genitourinary: Negative for dysuria and urgency.  Musculoskeletal: Negative for back pain.  Skin: Negative for rash.  Neurological: Positive for dizziness. Negative for speech change, focal weakness and headaches.  Endo/Heme/Allergies: Does not bruise/bleed easily.  Psychiatric/Behavioral: Negative for depression.    DRUG ALLERGIES:  No Known Allergies VITALS:  Blood pressure 140/67, pulse 68, temperature 97.8 F (36.6 C), temperature source Oral, resp. rate 18, height 5\' 11"  (1.803 m), weight 83.6 kg, SpO2 97 %. PHYSICAL EXAMINATION:  Physical Exam  Constitutional: He is oriented to person, place, and time.  HENT:  Head: Normocephalic and atraumatic.  Eyes: Pupils are equal, round, and reactive to light. Conjunctivae and EOM are normal.  Neck: Normal range of motion. Neck supple. No tracheal deviation present. No thyromegaly present.  Cardiovascular: Normal rate, regular rhythm and normal heart sounds.  Pulmonary/Chest: Effort normal and breath sounds normal. No respiratory distress. He has no wheezes. He exhibits no tenderness.  Abdominal: Soft. Bowel sounds are normal. He exhibits no  distension. There is no tenderness.  Musculoskeletal: Normal range of motion.  Neurological: He is alert and oriented to person, place, and time. No cranial nerve deficit.  Skin: Skin is warm and dry. No rash noted.   LABORATORY PANEL:  Male CBC Recent Labs  Lab 04/05/18 1049  WBC 4.8  HGB 13.3  HCT 39.4  PLT 185   ------------------------------------------------------------------------------------------------------------------ Chemistries  Recent Labs  Lab 04/05/18 1049 04/06/18 0652  NA 139 140  K 3.6 3.7  CL 102 106  CO2 23 27  GLUCOSE 166* 105*  BUN 14 12  CREATININE 0.83 0.68  CALCIUM 9.2 8.9  MG 1.8  --   AST 27  --   ALT 16  --   ALKPHOS 73  --   BILITOT 1.0  --    RADIOLOGY:  No results found. ASSESSMENT AND PLAN:  Tynell Winchell  is a 84 y.o. male with a known history of pretension, Carlos Frey, Barrett's esophagus admitted after he became lightheaded while doing prayer at the [employer]. 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 He sat down and slumped over without losing consciousness. He started feeling nauseous and had episode of vomiting couple times.  1. near syncope with possible arrhythmia, 1st AV block and occasional pauses consistent with sinus pauses -cardiology input appreciated - they don't think it's cardiac -echo to be read - performed later today -patient is not on any AV nodal blocking agents -blood pressure stable  2. hypertension continue home meds with amlodipine and lisinopril -hold hydrochlorothiazide  3. Carlos Frey continue PPI  4. Nausea/dizziness PRN meclizine -getting MRI of the brain  to rule out CVA     All the records are reviewed and case discussed with Care Management/Social Worker. Management plans discussed with the patient, family (wife at bedside) and they are in agreement.  CODE STATUS: Full Code  TOTAL  TIME TAKING CARE OF THIS PATIENT: 35 minutes.   More than 50% of the time was spent in counseling/coordination of care: YES  POSSIBLE D/C IN 1 DAYS, DEPENDING ON CLINICAL CONDITION.   Delfino Lovett M.D on 04/06/2018 at 10:29 PM  Between 7am to 6pm - Pager - [phone]  After 6pm go to [email or web address] - Social research officer, government  [facility] [place] [facility]  Office  [phone]  CC: Primary care physician; [name], MD  Note: This dictation was prepared with Dragon dictation along with smaller phrase technology. Any transcriptional errors that result from this process are unintentional.

## 2018-04-06 NOTE — Care Management Note (Signed)
Case Management Note  Patient Details  Name: Carlos Frey MRN: [id number] Date of Birth: Oct 09, 1929  Subjective/Objective:     Independent in all adls, denies issues accessing medical care, obtaining medications or with transportation.  Current with PCP.  No discharge needs identified at present by care manager or members of care team.                   Action/Plan:Pending echocardiogram today.   Expected Discharge Date:                  Expected Discharge Plan:  Home/Self Care  In-House Referral:     Discharge planning Services  CM Consult  Post Acute Care Choice:    Choice offered to:     DME Arranged:    DME Agency:     HH Arranged:    HH Agency:     Status of Service:  Completed, signed off  If discussed at [employer] of Stay Meetings, dates discussed:    Additional Comments:  Sherren Kerns, RN 04/06/2018, 1:51 PM

## 2018-04-06 NOTE — Progress Notes (Signed)
Progress Note  Patient Name: Carlos Frey Date of Encounter: 04/06/2018  Primary Cardiologist: [facility], Dr. Kirke Corin  Subjective   Patient denies any current cardiac complaints, including chest pain, palpitations, or feeling of racing HR.  He reportedly is feeling much improved from yesterday when he had repeated emesis and constant nausea. He does state he still feels "wobbly below the knees" but otherwise has been able to ambulate without difficulty and without a syncopal episode.   Pending echo.  Inpatient Medications    Scheduled Meds: . amLODipine  5 mg Oral Daily   And  . atorvastatin  20 mg Oral Daily  . aspirin EC  81 mg Oral Daily  . enoxaparin (LOVENOX) injection  40 mg Subcutaneous Q24H  . fluticasone  2 spray Each Nare Daily  . lisinopril  20 mg Oral Daily  . loratadine  10 mg Oral Daily  . montelukast  10 mg Oral QHS  . pantoprazole  40 mg Oral Daily   Continuous Infusions:  PRN Meds: acetaminophen **OR** acetaminophen, ondansetron **OR** ondansetron (ZOFRAN) IV, polyethylene glycol   Vital Signs    Vitals:   04/05/18 1724 04/05/18 1921 04/06/18 0338 04/06/18 0821  BP: (!) 179/67 (!) 161/73 (!) 128/50 (!) 166/67  Pulse: 70 69 65 61  Resp: 18     Temp: (!) 97.5 F (36.4 C) (!) 97.5 F (36.4 C) 97.7 F (36.5 C) 97.8 F (36.6 C)  TempSrc: Oral Oral Oral Oral  SpO2: 97% 99% 98% 98%  Weight: 83.6 kg     Height: 5\' 11"  (1.803 m)       Intake/Output Summary (Last 24 hours) at 04/06/2018 1207 Last data filed at 04/06/2018 0617 Gross per 24 hour  Intake 500 ml  Output 1700 ml  Net -1200 ml   Filed Weights   04/05/18 1036 04/05/18 1724  Weight: 84.8 kg 83.6 kg    Telemetry    SR, HR 50s-70s; 1st degree AV block with rare PVC, Personally Reviewed  ECG    No new tracings - Personally Reviewed  *Of note: He reports a previous EKG taken by his GI MD and available by calling [phone] and speaking with Lupita Leash if desired.  Physical Exam     GEN: No acute distress.   Neck: No JVD Cardiac: RRR, 2/6 systolic murmur, rubs, or gallops.  Respiratory: Clear to auscultation bilaterally. GI: Soft, nontender, non-distended  MS: No edema; No deformity. Neuro:  Nonfocal  Psych: Normal affect   Labs    Chemistry Recent Labs  Lab 04/05/18 1049 04/06/18 0652  NA 139 140  K 3.6 3.7  CL 102 106  CO2 23 27  GLUCOSE 166* 105*  BUN 14 12  CREATININE 0.83 0.68  CALCIUM 9.2 8.9  PROT 7.5  --   ALBUMIN 3.8  --   AST 27  --   ALT 16  --   ALKPHOS 73  --   BILITOT 1.0  --   GFRNONAA >60 >60  GFRAA >60 >60  ANIONGAP 14 7     Hematology Recent Labs  Lab 04/05/18 1049  WBC 4.8  RBC 4.05*  HGB 13.3  HCT 39.4  MCV 97.3  MCH 32.8  MCHC 33.8  RDW 13.2  PLT 185    Cardiac Enzymes Recent Labs  Lab 04/05/18 1049 04/06/18 0652  TROPONINI <0.03 <0.03   No results for input(s): TROPIPOC in the last 168 hours.   BNPNo results for input(s): BNP, PROBNP in the last 168 hours.  DDimer No results for input(s): DDIMER in the last 168 hours.   Radiology    No results found.  Cardiac Studies   Pending updated echo  Echocardiogram (08/06/2017 [facility]): Normal LV size with mild LVH.  LVEF greater than 55%.  Normal RV size and function.  Moderate left atrial enlargement.  Mild aortic stenosis and regurgitation.  Mild to moderate mitral regurgitation.  Mild tricuspid regurgitation.  Patient Profile     84 y.o. male with a hx of hypertension, GERD, and Barrett's esophagus who is being seen today for the evaluation of abnormal EKG and near syncope.  Assessment & Plan    Near Syncope in setting of 1st degree AV block - First degree AV block with rare PVCs noted on EKG and telemetry. Patient self reports a history of vertigo for years. Vasovagal etiology is considered given his GI illness / electrolyte imbalance as hypokalemic with multiple reported emesis episodes. Considered underestimated valvular heart  disease as possible etiology and pending updated TTE with previous results of 07/2017 echo above. Troponin negative x2.   - Continue ASA. Avoid AV nodal blocking agents ( blocker, non-dihydropyridine CCB, digoxin) in setting of near syncope with 1st degree AV block. Started on amlodipine 5mg  tablet on 10/27 - continue. Continue ACEi /lisinopril 20mg  daily.  - If follows up as outpatient with [facility], consider addition of spironolactone for further BP control as hypokalemic and Cr at baseline (0.68). For now, recommend replete potassium with goal 4.0. TSH 0.518. Consider checking A1C. Continue to monitor vital signs - Pending echo -  previous echo listed above. Further recommendations pending results of updated echo.  Hypertension - BP remains elevated at 166/67 - Continue BP medications with lisinopril, amlodipine. As above, if [facility] patient with regular follow-up could consider addition of spironolactone with close monitoring of potassium with outpatient labs.   Hypokalemia - K 3.7. Replete with goal 4.0. Mg 1.8 on 10/27. - Per IM  For questions or updates, please contact [facility] Please consult [email or web address] for contact info under        Signed, Lennon Alstrom, PA-C  04/06/2018, 12:07 PM

## 2018-04-06 NOTE — Care Management Obs Status (Signed)
MEDICARE OBSERVATION STATUS NOTIFICATION   Patient Details  Name: Carlos Frey MRN: [id number] Date of Birth: November 28, 1929   Medicare Observation Status Notification Given:  Yes    Sherren Kerns, RN 04/06/2018, 1:48 PM

## 2018-04-07 LAB — URINALYSIS, COMPLETE (UACMP) WITH MICROSCOPIC
BACTERIA UA: NONE SEEN
Bilirubin Urine: NEGATIVE
Glucose, UA: NEGATIVE mg/dL
Hgb urine dipstick: NEGATIVE
KETONES UR: NEGATIVE mg/dL
LEUKOCYTES UA: NEGATIVE
Nitrite: NEGATIVE
PROTEIN: NEGATIVE mg/dL
Specific Gravity, Urine: 1.024 (ref 1.005–1.030)
Squamous Epithelial / LPF: NONE SEEN (ref 0–5)
pH: 5 (ref 5.0–8.0)

## 2018-04-07 LAB — CBC
HEMATOCRIT: 38 % — AB (ref 39.0–52.0)
HEMOGLOBIN: 12.6 g/dL — AB (ref 13.0–17.0)
MCH: 33 pg (ref 26.0–34.0)
MCHC: 33.2 g/dL (ref 30.0–36.0)
MCV: 99.5 fL (ref 80.0–100.0)
NRBC: 0 % (ref 0.0–0.2)
Platelets: 179 10*3/uL (ref 150–400)
RBC: 3.82 MIL/uL — ABNORMAL LOW (ref 4.22–5.81)
RDW: 13.5 % (ref 11.5–15.5)
WBC: 5.8 10*3/uL (ref 4.0–10.5)

## 2018-04-07 LAB — ECHOCARDIOGRAM COMPLETE
Height: 71 in
WEIGHTICAEL: 2948.87 [oz_av]

## 2018-04-07 LAB — BASIC METABOLIC PANEL
ANION GAP: 7 (ref 5–15)
BUN: 18 mg/dL (ref 8–23)
CALCIUM: 8.4 mg/dL — AB (ref 8.9–10.3)
CHLORIDE: 108 mmol/L (ref 98–111)
CO2: 29 mmol/L (ref 22–32)
Creatinine, Ser: 0.8 mg/dL (ref 0.61–1.24)
GFR calc Af Amer: >60 mL/min (ref >60)
GFR calc non Af Amer: >60 mL/min (ref >60)
Glucose, Bld: 104 mg/dL — ABNORMAL HIGH (ref 70–99)
Potassium: 3.8 mmol/L (ref 3.5–5.1)
SODIUM: 144 mmol/L (ref 135–145)

## 2018-04-07 MED ORDER — HYDROCHLOROTHIAZIDE 12.5 MG PO CAPS: 12.5000 mg | ORAL_CAPSULE | Freq: Every day | ORAL | Status: DC

## 2018-04-07 NOTE — Progress Notes (Signed)
Patient alert and oriented, vss, no compliants of pain.  D/c telemetry and PIV.  To be escorted out of hospital via wheelchair by volunteers.

## 2018-04-07 NOTE — Progress Notes (Signed)
Pt. stated that he could not tolerate the MRI exam due to feeling claustrophobic.  No other issues noted.  Will continue to monitor.

## 2018-04-07 NOTE — Plan of Care (Signed)
Pt. w/o concerns during this shift.  Unable to tolerate the MRI exam.  Will continue to monitor.

## 2018-04-07 NOTE — Discharge Instructions (Signed)

## 2018-04-09 NOTE — Discharge Summary (Signed)
[facility] [place] [facility] [place]   PATIENT NAME: Carlos Frey    MR#:  [id number]  DATE OF BIRTH:  20-Jun-1929  DATE OF ADMISSION:  04/05/2018   ADMITTING PHYSICIAN: Enedina Finner, MD  DATE OF DISCHARGE: 04/07/2018 11:36 AM  PRIMARY CARE PHYSICIAN: [name], MD   ADMISSION DIAGNOSIS:  Near syncope [R55] 1st degree AV block [I44.0] DISCHARGE DIAGNOSIS:  Active Problems:   Near syncope   1st degree AV block  SECONDARY DIAGNOSIS:   Past Medical History:  Diagnosis Date  . Barrett esophagus   . GERD (gastroesophageal reflux disease)   . High cholesterol   . Hypertension    HOSPITAL COURSE:  ClarenceCarteris a84 y.o.malewith a known history of pretension, [name], Barrett's esophagus admitted after he became lightheaded while doing prayer at the Sunday school. 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 He sat down and slumped over without losing consciousness. He started feeling nauseous and had episode of vomiting couple times.  1.near syncope with possible arrhythmia, 1st AV block and occasional sinus pauses -cardiology don't think it's cardiac -echo normal  2.hypertension : stable on home meds  3.Gerd - stable on PPI  4.Nausea/dizziness PRN meclizine - MRI brain ordered but he couldn't tolerate due to claustrophobia DISCHARGE CONDITIONS:  stable CONSULTS OBTAINED:  Treatment Team:  Yvonne Kendall, MD DRUG ALLERGIES:  No Known Allergies DISCHARGE MEDICATIONS:   Allergies as of 04/07/2018   No Known Allergies     Medication List    TAKE these medications   amLODipine-atorvastatin 5-20 MG tablet Commonly known as:  CADUET Take 1 tablet by mouth daily.   aspirin EC 81 MG tablet Take 81 mg by mouth daily.   cetirizine 10 MG tablet Commonly known as:  ZYRTEC Take 10 mg by mouth daily.   fluticasone  50 MCG/ACT nasal spray Commonly known as:  FLONASE Place 2 sprays into both nostrils daily.   lisinopril-hydrochlorothiazide 20-12.5 MG tablet Commonly known as:  PRINZIDE,ZESTORETIC Take 1 tablet by mouth daily.   montelukast 10 MG tablet Commonly known as:  SINGULAIR Take 10 mg by mouth at bedtime.   omeprazole 20 MG capsule Commonly known as:  PRILOSEC Take 20 mg by mouth daily.        DISCHARGE INSTRUCTIONS:   DIET:  Cardiac diet DISCHARGE CONDITION:  Good ACTIVITY:  Activity as tolerated OXYGEN:  Home Oxygen: No.  Oxygen Delivery: room air DISCHARGE LOCATION:  home   If you experience worsening of your admission symptoms, develop shortness of breath, life threatening emergency, suicidal or homicidal thoughts you must seek medical attention immediately by calling 911 or calling your MD immediately  if symptoms less severe.  You Must read complete instructions/literature along with all the possible adverse reactions/side effects for all the Medicines you take and that have been prescribed to you. Take any new Medicines after you have completely understood and accpet all the possible adverse reactions/side effects.   Please note  You were cared for by a hospitalist during your hospital stay. If you have any questions about your discharge medications or the care you received while you were in the hospital after you are discharged, you can call the unit and asked to speak with the hospitalist on call if the hospitalist that took care of you is not available. Once you are discharged, your primary care physician will handle any further medical issues.  Please note that NO REFILLS for any discharge medications will be authorized once you are discharged, as it is imperative that you return to your primary care physician (or establish a relationship with a primary care physician if you do not have one) for your aftercare needs so that they can reassess your need for medications and  monitor your lab values.    On the day of Discharge:  VITAL SIGNS:  Blood pressure (!) 167/68, pulse 60, temperature (!) 97.4 F (36.3 C), temperature source Oral, resp. rate 18, height 5\' 11"  (1.803 m), weight 83.6 kg, SpO2 97 %. PHYSICAL EXAMINATION:  GENERAL:  84 y.o.-year-old patient lying in the bed with no acute distress.  EYES: Pupils equal, round, reactive to light and accommodation. No scleral icterus. Extraocular muscles intact.  HEENT: Head atraumatic, normocephalic. Oropharynx and nasopharynx clear.  NECK:  Supple, no jugular venous distention. No thyroid enlargement, no tenderness.  LUNGS: Normal breath sounds bilaterally, no wheezing, rales,rhonchi or crepitation. No use of accessory muscles of respiration.  CARDIOVASCULAR: S1, S2 normal. No murmurs, rubs, or gallops.  ABDOMEN: Soft, non-tender, non-distended. Bowel sounds present. No organomegaly or mass.  EXTREMITIES: No pedal edema, cyanosis, or clubbing.  NEUROLOGIC: Cranial nerves II through XII are intact. Muscle strength 5/5 in all extremities. Sensation intact. Gait not checked.  PSYCHIATRIC: The patient is alert and oriented x 3.  SKIN: No obvious rash, lesion, or ulcer.  DATA REVIEW:   CBC Recent Labs  Lab 04/07/18 0655  WBC 5.8  HGB 12.6*  HCT 38.0*  PLT 179    Chemistries  Recent Labs  Lab 04/05/18 1049  04/07/18 0655  NA 139   < > 144  K 3.6   < > 3.8  CL 102   < > 108  CO2 23   < > 29  GLUCOSE 166*   < > 104*  BUN 14   < > 18  CREATININE 0.83   < > 0.80  CALCIUM 9.2   < > 8.4*  MG 1.8  --   --   AST 27  --   --   ALT 16  --   --   ALKPHOS 73  --   --   BILITOT 1.0  --   --    < > = values in this interval not displayed.     Follow-up Information    [name], MD. Schedule an appointment as soon as possible for a visit in 5 days.   Specialty:  Family Medicine Why:  Appointment Time: @ pt will need to call office for appt Contact information: [address] [facility] [place] [address] [phone]        Vernie Murders, MD. Schedule an appointment as soon as possible for a visit on 04/10/2018.   Specialty:  Otolaryngology Why:  Appointment Time: @ 10:30 with insurance card and ID Contact information: [address] [place] [address] [phone]        Creig Hines, NP. Schedule an appointment as soon as possible for a visit on 05/12/2018.   Specialties:  Nurse Practitioner, Cardiology, Radiology Why:  Appointment Time: @ 9:00am Contact information: [address] [place] [address] [phone]           Management plans discussed with the patient, family and they are in agreement.  CODE STATUS: Prior   TOTAL TIME TAKING CARE OF THIS PATIENT: 35 minutes.  Delfino Lovett M.D on 04/09/2018 at 7:31 PM  Between 7am to 6pm - Pager - [phone]  After 6pm go to [email or web address] - Social research officer, government  [facility]  Office  [phone]  CC: Primary care physician; [name], MD   Note: This dictation was prepared with Dragon dictation along with smaller phrase technology. Any transcriptional errors that result from this process are unintentional.

## 2018-05-12 ENCOUNTER — Ambulatory Visit: Payer: Medicare Other | Admitting: Nurse Practitioner

## 2019-11-30 DIAGNOSIS — D692 Other nonthrombocytopenic purpura: Secondary | ICD-10-CM | POA: Insufficient documentation

## 2021-05-03 ENCOUNTER — Other Ambulatory Visit: Payer: Self-pay

## 2021-05-03 ENCOUNTER — Emergency Department: Payer: Medicare Other

## 2021-05-03 ENCOUNTER — Observation Stay
Admission: EM | Admit: 2021-05-03 | Discharge: 2021-05-04 | Disposition: A | Discharge status: Home / Self Care | Payer: Medicare Other | Attending: Internal Medicine | Admitting: Internal Medicine

## 2021-05-03 DIAGNOSIS — Z7982 Long term (current) use of aspirin: Secondary | ICD-10-CM | POA: Diagnosis not present

## 2021-05-03 DIAGNOSIS — Z20822 Contact with and (suspected) exposure to covid-19: Secondary | ICD-10-CM | POA: Diagnosis not present

## 2021-05-03 DIAGNOSIS — Z79899 Other long term (current) drug therapy: Secondary | ICD-10-CM | POA: Insufficient documentation

## 2021-05-03 DIAGNOSIS — G9341 Metabolic encephalopathy: Secondary | ICD-10-CM | POA: Diagnosis not present

## 2021-05-03 DIAGNOSIS — B348 Other viral infections of unspecified site: Secondary | ICD-10-CM | POA: Diagnosis not present

## 2021-05-03 DIAGNOSIS — R4182 Altered mental status, unspecified: Secondary | ICD-10-CM | POA: Diagnosis present

## 2021-05-03 DIAGNOSIS — I10 Essential (primary) hypertension: Secondary | ICD-10-CM | POA: Diagnosis not present

## 2021-05-03 DIAGNOSIS — R509 Fever, unspecified: Secondary | ICD-10-CM

## 2021-05-03 LAB — CBC
HCT: 43.7 % (ref 39.0–52.0)
Hemoglobin: 15.1 g/dL (ref 13.0–17.0)
MCH: 33.8 pg (ref 26.0–34.0)
MCHC: 34.6 g/dL (ref 30.0–36.0)
MCV: 97.8 fL (ref 80.0–100.0)
Platelets: 175 10*3/uL (ref 150–400)
RBC: 4.47 MIL/uL (ref 4.22–5.81)
RDW: 13.3 % (ref 11.5–15.5)
WBC: 9.1 10*3/uL (ref 4.0–10.5)
nRBC: 0 % (ref 0.0–0.2)

## 2021-05-03 LAB — COMPREHENSIVE METABOLIC PANEL
ALT: 17 U/L (ref 0–44)
AST: 28 U/L (ref 15–41)
Albumin: 4.6 g/dL (ref 3.5–5.0)
Alkaline Phosphatase: 84 U/L (ref 38–126)
Anion gap: 9 (ref 5–15)
BUN: 16 mg/dL (ref 8–23)
CO2: 26 mmol/L (ref 22–32)
Calcium: 9.4 mg/dL (ref 8.9–10.3)
Chloride: 102 mmol/L (ref 98–111)
Creatinine, Ser: 0.76 mg/dL (ref 0.61–1.24)
GFR, Estimated: >60 mL/min (ref >60)
Glucose, Bld: 110 mg/dL — ABNORMAL HIGH (ref 70–99)
Potassium: 4 mmol/L (ref 3.5–5.1)
Sodium: 137 mmol/L (ref 135–145)
Total Bilirubin: 0.9 mg/dL (ref 0.3–1.2)
Total Protein: 8.4 g/dL — ABNORMAL HIGH (ref 6.5–8.1)

## 2021-05-03 LAB — URINALYSIS, MICROSCOPIC (REFLEX)
Bacteria, UA: NONE SEEN
Squamous Epithelial / HPF: NONE SEEN (ref 0–5)

## 2021-05-03 LAB — CBG MONITORING, ED: Glucose-Capillary: 118 mg/dL — ABNORMAL HIGH (ref 70–99)

## 2021-05-03 LAB — URINALYSIS, ROUTINE W REFLEX MICROSCOPIC
Bilirubin Urine: NEGATIVE
Glucose, UA: NEGATIVE mg/dL
Ketones, ur: NEGATIVE mg/dL
Leukocytes,Ua: NEGATIVE
Nitrite: NEGATIVE
Protein, ur: 30 mg/dL — AB
Specific Gravity, Urine: 1.02 (ref 1.005–1.030)
pH: 8.5 — ABNORMAL HIGH (ref 5.0–8.0)

## 2021-05-03 NOTE — ED Triage Notes (Signed)
Pt woke up from nap around 17:00.

## 2021-05-03 NOTE — ED Triage Notes (Signed)
Pt was at home today on his tractor, came in watched some TV, took a nap, when woke up from nap was confused, couldn't think what channel to go to.  Wife was asking questions that was confusing to him, then all of a sudden people were standing over him.

## 2021-05-03 NOTE — ED Provider Notes (Signed)
Emergency Medicine Provider Triage Evaluation Note  Carlos Frey, a 87 y.o. male  was evaluated in triage.  Pt complains of AMS.  She presents to the ED via EMS from home, with family called out because the patient woke up quite confused after a nap.  No reported head injury, LOC, fever, chills, sweats, chest pain, cough.  Patient was last known well and at baseline, prior to 1700 when he woke from his nap.  Review of Systems  Positive: AMS Negative: FCS  Physical Exam  BP 126/87 (BP Location: Left Arm)   Pulse 87   Temp 98.4 F (36.9 C) (Oral)   Resp 18   SpO2 96%  Gen:   Awake, no distress  NAD Resp:  Normal effort CTA MSK:   Moves extremities without difficulty  Other:  CVS: RRR  Medical Decision Making  Medically screening exam initiated at 7:46 PM.  Appropriate orders placed.  CHARLIES RAYBURN was informed that the remainder of the evaluation will be completed by another provider, this initial triage assessment does not replace that evaluation, and the importance of remaining in the ED until their evaluation is complete.  Geriatric patient with ED evaluation of increased confusion and altered mental status.   Lissa Hoard, PA-C 05/03/21 1948    Concha Se, MD 05/03/21 [phone]

## 2021-05-04 ENCOUNTER — Emergency Department: Payer: Medicare Other

## 2021-05-04 DIAGNOSIS — I10 Essential (primary) hypertension: Secondary | ICD-10-CM

## 2021-05-04 DIAGNOSIS — B348 Other viral infections of unspecified site: Secondary | ICD-10-CM | POA: Insufficient documentation

## 2021-05-04 DIAGNOSIS — R410 Disorientation, unspecified: Secondary | ICD-10-CM | POA: Insufficient documentation

## 2021-05-04 DIAGNOSIS — G9341 Metabolic encephalopathy: Secondary | ICD-10-CM | POA: Diagnosis not present

## 2021-05-04 LAB — RESPIRATORY PANEL BY PCR

## 2021-05-04 LAB — CBC
HCT: 36.7 % — ABNORMAL LOW (ref 39.0–52.0)
Hemoglobin: 12.8 g/dL — ABNORMAL LOW (ref 13.0–17.0)
MCH: 33.6 pg (ref 26.0–34.0)
MCHC: 34.9 g/dL (ref 30.0–36.0)
MCV: 96.3 fL (ref 80.0–100.0)
Platelets: 160 10*3/uL (ref 150–400)
RBC: 3.81 MIL/uL — ABNORMAL LOW (ref 4.22–5.81)
RDW: 13.3 % (ref 11.5–15.5)
WBC: 8.9 10*3/uL (ref 4.0–10.5)
nRBC: 0 % (ref 0.0–0.2)

## 2021-05-04 LAB — CREATININE, SERUM
Creatinine, Ser: 0.87 mg/dL (ref 0.61–1.24)
GFR, Estimated: >60 mL/min (ref >60)

## 2021-05-04 LAB — RESP PANEL BY RT-PCR (FLU A&B, COVID) ARPGX2
Influenza A by PCR: NEGATIVE
Influenza B by PCR: NEGATIVE
SARS Coronavirus 2 by RT PCR: NEGATIVE

## 2021-05-04 LAB — LACTIC ACID, PLASMA: Lactic Acid, Venous: 1.7 mmol/L (ref 0.5–1.9)

## 2021-05-04 LAB — PROCALCITONIN: Procalcitonin: <0.1 ng/mL

## 2021-05-04 MED ORDER — ACETAMINOPHEN 325 MG PO TABS: 650.0000 mg | ORAL_TABLET | Freq: Four times a day (QID) | ORAL | Status: DC | PRN

## 2021-05-04 MED ORDER — ONDANSETRON HCL 4 MG/2ML IJ SOLN: 4.0000 mg | Freq: Four times a day (QID) | INTRAMUSCULAR | Status: DC | PRN

## 2021-05-04 MED ORDER — ACETAMINOPHEN 500 MG PO TABS
1000.0000 mg | ORAL_TABLET | Freq: Once | ORAL | Status: AC
  Administered 2021-05-04: 1000 mg via ORAL
  Filled 2021-05-04: qty 2

## 2021-05-04 MED ORDER — ACETAMINOPHEN 325 MG RE SUPP: 650.0000 mg | Freq: Four times a day (QID) | RECTAL | Status: DC | PRN

## 2021-05-04 MED ORDER — ONDANSETRON HCL 4 MG PO TABS: 4.0000 mg | ORAL_TABLET | Freq: Four times a day (QID) | ORAL | Status: DC | PRN

## 2021-05-04 MED ORDER — ENOXAPARIN SODIUM 40 MG/0.4ML IJ SOSY
40.0000 mg | PREFILLED_SYRINGE | INTRAMUSCULAR | Status: DC
  Administered 2021-05-04: 40 mg via SUBCUTANEOUS
  Filled 2021-05-04: qty 0.4

## 2021-05-04 NOTE — Progress Notes (Signed)
Temp-98.8 orally. Patient alert x2 but mildly confused. C/o no pain or discomfort. O2 sats 96% on RA. Will continue to monitor to end of shift.

## 2021-05-04 NOTE — Hospital Course (Signed)
Carlos Frey is an 87 yo male with PMH HTN who presented with AMS.  He had presented after developing worsening confusion at home and watching the TV.  He was reported to have some rambling of his speech per family and was complaining of sore throat, chills, and then developed a fever.  EMS was called and he was brought to the ER for further evaluation. COVID and flu swabs were negative.  CXR was unremarkable. CT head also showing no acute changes with underlying mild chronic microvascular disease. Respiratory virus panel sent and was positive for rhinovirus. This was likely etiology of his presentation. His mentation improved the morning of admission and he was felt stable for discharge home with continued supportive measures at home. Patient was AOx4 prior to discharge and voiced understand to instructions.

## 2021-05-04 NOTE — ED Notes (Signed)
Report received from Dorian, RN 

## 2021-05-04 NOTE — Discharge Summary (Signed)
Physician Discharge Summary   Patient name: Carlos Frey  Admit date:     05/03/2021  Discharge date: 05/04/2021  Discharge Physician: Lewie Chamber   PCP: [name], MD   Recommendations at discharge: Continue outpatient routine medical care  Discharge Diagnoses Active Problems:   Hypertension   Rhinovirus infection   Resolved Diagnoses Principal Problem (Resolved):   Acute metabolic encephalopathy   Hospital Course   Carlos Frey is an 87 yo male with PMH HTN who presented with AMS.  He had presented after developing worsening confusion at home and watching the TV.  He was reported to have some rambling of his speech per family and was complaining of sore throat, chills, and then developed a fever.  EMS was called and he was brought to the ER for further evaluation. COVID and flu swabs were negative.  CXR was unremarkable. CT head also showing no acute changes with underlying mild chronic microvascular disease. Respiratory virus panel sent and was positive for rhinovirus. This was likely etiology of his presentation. His mentation improved the morning of admission and he was felt stable for discharge home with continued supportive measures at home. Patient was AOx4 prior to discharge and voiced understand to instructions.     Procedures performed:    Condition at discharge: stable  Exam Physical Exam Constitutional:      General: He is not in acute distress.    Appearance: Normal appearance.  HENT:     Head: Normocephalic and atraumatic.     Mouth/Throat:     Mouth: Mucous membranes are moist.  Eyes:     Extraocular Movements: Extraocular movements intact.  Cardiovascular:     Rate and Rhythm: Normal rate and regular rhythm.     Heart sounds: Normal heart sounds.  Pulmonary:     Effort: Pulmonary effort is normal. No respiratory distress.     Breath sounds: Normal breath sounds. No wheezing.  Abdominal:     General: Bowel sounds are normal. There is no  distension.     Palpations: Abdomen is soft.     Tenderness: There is no abdominal tenderness.  Musculoskeletal:        General: Normal range of motion.     Cervical back: Normal range of motion and neck supple.  Skin:    General: Skin is warm and dry.  Neurological:     General: No focal deficit present.     Mental Status: He is alert.  Psychiatric:        Mood and Affect: Mood normal.        Behavior: Behavior normal.     Disposition: Home  Discharge time: greater than 30 minutes.   Allergies as of 05/04/2021   No Known Allergies      Medication List     TAKE these medications    amLODipine-atorvastatin 5-20 MG tablet Commonly known as: CADUET Take 1 tablet by mouth daily.   aspirin EC 81 MG tablet Take 81 mg by mouth daily.   cetirizine 10 MG tablet Commonly known as: ZYRTEC Take 10 mg by mouth daily.   fluticasone 50 MCG/ACT nasal spray Commonly known as: FLONASE Place 2 sprays into both nostrils daily.   lisinopril-hydrochlorothiazide 20-12.5 MG tablet Commonly known as: ZESTORETIC Take 1 tablet by mouth daily.   montelukast 10 MG tablet Commonly known as: SINGULAIR Take 10 mg by mouth at bedtime.   omeprazole 20 MG capsule Commonly known as: PRILOSEC Take 20 mg by mouth daily.  DG Chest 2 View  Result Date: 05/04/2021 CLINICAL DATA:  Fever, confusion EXAM: CHEST - 2 VIEW COMPARISON:  Chest x-ray 12/22/2011 FINDINGS: The heart and mediastinal contours are unchanged. Aortic calcification. No focal consolidation. Persistent coarsened interstitial markings with no overt pulmonary edema. No pleural effusion. No pneumothorax. No acute osseous abnormality. Multilevel degenerative changes the spine with limited evaluation due to overlying tissues and osseous structures. IMPRESSION: No active cardiopulmonary disease. Electronically Signed   By: Tish Frederickson M.D.   On: 05/04/2021 00:34   CT HEAD WO CONTRAST ( )  Result Date:  05/04/2021 CLINICAL DATA:  Altered mental status. EXAM: CT HEAD WITHOUT CONTRAST TECHNIQUE: Contiguous axial images were obtained from the base of the skull through the vertex without intravenous contrast. COMPARISON:  None. FINDINGS: Brain: The ventricles and sulci are appropriate size for patient's age. Mild periventricular and deep white matter chronic microvascular ischemic changes noted. There is no acute intracranial hemorrhage. No mass effect or midline shift. No extra-axial fluid collection. Vascular: No hyperdense vessel or unexpected calcification. Skull: Normal. Negative for fracture or focal lesion. Sinuses/Orbits: No acute finding. Other: None IMPRESSION: 1. No acute intracranial pathology. 2. Mild chronic microvascular ischemic changes. Electronically Signed   By: Elgie Collard M.D.   On: 05/04/2021 00:14   Results for orders placed or performed during the hospital encounter of 05/03/21  Culture, blood (Routine X 2) w Reflex to ID Panel     Status: None (Preliminary result)   Collection Time: 05/04/21 12:59 AM   Specimen: BLOOD  Result Value Ref Range Status   Specimen Description BLOOD LEFT FA  Final   Special Requests   Final    BOTTLES DRAWN AEROBIC AND ANAEROBIC Blood Culture adequate volume   Culture   Final    NO GROWTH < 12 HOURS Performed at [facility], [address], [place], [place] [address]    Report Status PENDING  Incomplete  Culture, blood (Routine X 2) w Reflex to ID Panel     Status: None (Preliminary result)   Collection Time: 05/04/21  1:00 AM   Specimen: BLOOD  Result Value Ref Range Status   Specimen Description BLOOD RIGHT FA  Final   Special Requests   Final    BOTTLES DRAWN AEROBIC AND ANAEROBIC Blood Culture results may not be optimal due to an excessive volume of blood received in culture bottles   Culture   Final    NO GROWTH < 12 HOURS Performed at [facility], [address] [place], [place] [address]    Report Status  PENDING  Incomplete  Resp Panel by RT-PCR (Flu A&B, Covid) Nasopharyngeal Swab     Status: None   Collection Time: 05/04/21  2:30 AM   Specimen: Nasopharyngeal Swab; Nasopharyngeal(NP) swabs in vial transport medium  Result Value Ref Range Status   SARS Coronavirus 2 by RT PCR NEGATIVE NEGATIVE Final    Comment: (NOTE) SARS-CoV-2 target nucleic acids are NOT DETECTED.  The SARS-CoV-2 RNA is generally detectable in upper respiratory specimens during the acute phase of infection. The lowest concentration of SARS-CoV-2 viral copies this assay can detect is 138 copies/mL. A negative result does not preclude SARS-Cov-2 infection and should not be used as the sole basis for treatment or other patient management decisions. A negative result may occur with  improper specimen collection/handling, submission of specimen other than nasopharyngeal swab, presence of viral mutation(s) within the areas targeted by this assay, and inadequate number of viral copies(<138 copies/mL). A negative  result must be combined with clinical observations, patient history, and epidemiological information. The expected result is Negative.  Fact Sheet for Patients:  BloggerCourse.com  Fact Sheet for Healthcare Providers:  SeriousBroker.it  This test is no t yet approved or cleared by the [place] FDA and  has been authorized for detection and/or diagnosis of SARS-CoV-2 by FDA under an Emergency Use Authorization (EUA). This EUA will remain  in effect (meaning this test can be used) for the duration of the COVID-19 declaration under Section 564(b)(1) of the Act, 21 U.S.C.section 360bbb-3(b)(1), unless the authorization is terminated  or revoked sooner.       Influenza A by PCR NEGATIVE NEGATIVE Final   Influenza B by PCR NEGATIVE NEGATIVE Final    Comment: (NOTE) The Xpert Xpress SARS-CoV-2/FLU/RSV plus assay is intended as an aid in the diagnosis of  influenza from Nasopharyngeal swab specimens and should not be used as a sole basis for treatment. Nasal washings and aspirates are unacceptable for Xpert Xpress SARS-CoV-2/FLU/RSV testing.  Fact Sheet for Patients: BloggerCourse.com  Fact Sheet for Healthcare Providers: SeriousBroker.it  This test is not yet approved or cleared by the [place] FDA and has been authorized for detection and/or diagnosis of SARS-CoV-2 by FDA under an Emergency Use Authorization (EUA). This EUA will remain in effect (meaning this test can be used) for the duration of the COVID-19 declaration under Section 564(b)(1) of the Act, 21 U.S.C. section 360bbb-3(b)(1), unless the authorization is terminated or revoked.  Performed at [facility], [address] [place], [place] [address]   Respiratory (~20 pathogens) panel by PCR     Status: Abnormal   Collection Time: 05/04/21  2:30 AM   Specimen: Nasopharyngeal Swab; Respiratory  Result Value Ref Range Status   Adenovirus NOT DETECTED NOT DETECTED Final   Coronavirus 229E NOT DETECTED NOT DETECTED Final    Comment: (NOTE) The Coronavirus on the Respiratory Panel, DOES NOT test for the novel  Coronavirus (2019 nCoV)    Coronavirus HKU1 NOT DETECTED NOT DETECTED Final   Coronavirus NL63 NOT DETECTED NOT DETECTED Final   Coronavirus OC43 NOT DETECTED NOT DETECTED Final   Metapneumovirus NOT DETECTED NOT DETECTED Final   Rhinovirus / Enterovirus DETECTED (A) NOT DETECTED Final   Influenza A NOT DETECTED NOT DETECTED Final   Influenza B NOT DETECTED NOT DETECTED Final   Parainfluenza Virus 1 NOT DETECTED NOT DETECTED Final   Parainfluenza Virus 2 NOT DETECTED NOT DETECTED Final   Parainfluenza Virus 3 NOT DETECTED NOT DETECTED Final   Parainfluenza Virus 4 NOT DETECTED NOT DETECTED Final   Respiratory Syncytial Virus NOT DETECTED NOT DETECTED Final   Bordetella pertussis NOT DETECTED NOT  DETECTED Final   Bordetella Parapertussis NOT DETECTED NOT DETECTED Final   Chlamydophila pneumoniae NOT DETECTED NOT DETECTED Final   Mycoplasma pneumoniae NOT DETECTED NOT DETECTED Final    Comment: Performed at [facility], [address], [place], [place] [address]    Signed:  Lewie Chamber MD.  [facility] 05/04/2021, 4:04 PM

## 2021-05-04 NOTE — ED Notes (Signed)
Carlos Frey  (Spouse)      [phone]   Bonnie--granddaughter [phone]     ** Please call if patient gets a room or with any changes or updates**

## 2021-05-04 NOTE — H&P (Signed)
History and Physical    SAMBA CUMBA KWI:[id number] DOB: [birth year] DOA: 05/03/2021  PCP: [name], MD   Patient coming from: home  I have personally briefly reviewed patient's relevant medical records in [facility] [employer] [facility]  Chief Complaint: confusion  HPI: Carlos Frey is a 87 y.o. male with medical history significant for Hypertension who presents to the ED with altered mental status.  Patient was in his usual state of health but then became confused and was having difficulty telling his wife with sternal he wanted to watch on the TV.  According to the wife he was rambling.  He did complain of chills and a scratchy throat but denies cough or shortness of breath.  Denies nausea, vomiting, abdominal pain or diarrhea.  He was less confused by arrival in the ED.  ED course: Temperature 102 with otherwise normal vitals Blood work: WBC 9.1 with lactic acid 1.7 and procalcitonin less than 0.1.  Otherwise unremarkable  EKG, personally viewed and interpreted sinus at 76 with no acute ST-T wave changes  Chest x-ray with no active disease CT head no acute intracranial pathology  Patient given Tylenol.  Hospitalist consulted for admission.    Review of Systems: As per HPI otherwise all other systems on review of systems negative.    Past Medical History:  Diagnosis Date   Barrett esophagus    GERD (gastroesophageal reflux disease)    High cholesterol    Hypertension     Past Surgical History:  Procedure Laterality Date   APPENDECTOMY     COLONOSCOPY  08/08/03, 12/05/08 & 12/14/13   ESOPHAGOGASTRODUODENOSCOPY  10/09/09, 12/25/09 & 03/24/12   ESOPHAGOGASTRODUODENOSCOPY (EGD) WITH PROPOFOL N/A 07/17/2017   Procedure: ESOPHAGOGASTRODUODENOSCOPY (EGD) WITH PROPOFOL;  Surgeon: Christena Deem, MD;  Location: [facility] ENDOSCOPY;  Service: Endoscopy;  Laterality: N/A;   TONSILLECTOMY       reports that he quit smoking about 57 years ago. His smoking use included pipe and cigars. He  has never used smokeless tobacco. He reports that he does not drink alcohol and does not use drugs.  No Known Allergies  History reviewed. No pertinent family history.   Prior to Admission medications   Medication Sig Start Date End Date Taking? Authorizing Provider  amLODipine-atorvastatin (CADUET) 5-20 MG per tablet Take 1 tablet by mouth daily.    [provider]  aspirin EC 81 MG tablet Take 81 mg by mouth daily.    [provider]  cetirizine (ZYRTEC) 10 MG tablet Take 10 mg by mouth daily.    [provider]  fluticasone (FLONASE) 50 MCG/ACT nasal spray Place 2 sprays into both nostrils daily. 04/03/18   [provider]  lisinopril-hydrochlorothiazide (PRINZIDE,ZESTORETIC) 20-12.5 MG per tablet Take 1 tablet by mouth daily.    [provider]  montelukast (SINGULAIR) 10 MG tablet Take 10 mg by mouth at bedtime. 04/03/18   [provider]  omeprazole (PRILOSEC) 20 MG capsule Take 20 mg by mouth daily.    [provider]    Physical Exam: Vitals:   05/03/21 2307 05/04/21 0146 05/04/21 0236 05/04/21 0254  BP: (!) 151/76 (!) 147/70  (!) 149/58  Pulse: 76 69  89  Resp: 17 14  17   Temp: 98.4 F (36.9 C)  (S) (!) 102 F (38.9 C)   TempSrc: Oral  (S) Rectal   SpO2: 96% 96%  96%  Weight:      Height:       Constitutional: Alert and oriented x 2 .  Not in any apparent distress HEENT:      Head: Normocephalic and atraumatic.         Eyes: PERLA, EOMI, Conjunctivae are normal. Sclera is non-icteric.       Mouth/Throat: Mucous membranes are moist.       Neck: Supple with no signs of meningismus. Cardiovascular: Regular rate and rhythm. No murmurs, gallops, or rubs. 2+ symmetrical distal pulses are present . No JVD. No  LE edema Respiratory: Respiratory effort normal .Lungs sounds clear bilaterally. No wheezes, crackles, or rhonchi.  Gastrointestinal: Soft, non tender, non distended. Positive bowel sounds.  Genitourinary:  No CVA tenderness. Musculoskeletal: Nontender with normal range of motion in all extremities. No cyanosis, or erythema of extremities. Neurologic:  Face is symmetric. Moving all extremities. No gross focal neurologic deficits . Skin: Skin is warm, dry.  No rash or ulcers Psychiatric: Mood and affect are appropriate    Labs on Admission: I have personally reviewed following labs and imaging studies  CBC: Recent Labs  Lab 05/03/21 1956  WBC 9.1  HGB 15.1  HCT 43.7  MCV 97.8  PLT 175   Basic Metabolic Panel: Recent Labs  Lab 05/03/21 1956  NA 137  K 4.0  CL 102  CO2 26  GLUCOSE 110*  BUN 16  CREATININE 0.76  CALCIUM 9.4   GFR: Estimated Creatinine Clearance: 68 mL/min (by C-G formula based on SCr of 0.76 mg/dL). Liver Function Tests: Recent Labs  Lab 05/03/21 1956  AST 28  ALT 17  ALKPHOS 84  BILITOT 0.9  PROT 8.4*  ALBUMIN 4.6   No results for input(s): LIPASE, AMYLASE in the last 168 hours. No results for input(s): AMMONIA in the last 168 hours. Coagulation Profile: No results for input(s): INR, PROTIME in the last 168 hours. Cardiac Enzymes: No results for input(s): CKTOTAL, CKMB, CKMBINDEX, TROPONINI in the last 168 hours. BNP (last 3 results) No results for input(s): PROBNP in the last 8760 hours. HbA1C: No results for input(s): HGBA1C in the last 72 hours. CBG: Recent Labs  Lab 05/03/21 1955  GLUCAP 118*   Lipid Profile: No results for input(s): CHOL, HDL, LDLCALC, TRIG, CHOLHDL, LDLDIRECT in the last 72 hours. Thyroid Function Tests: No results for input(s): TSH, T4TOTAL, FREET4, T3FREE, THYROIDAB in the last 72 hours. Anemia Panel: No results for input(s): VITAMINB12, FOLATE, FERRITIN, TIBC, IRON, RETICCTPCT in the last 72 hours. Urine analysis:    Component Value Date/Time   COLORURINE YELLOW 05/03/2021 1937   APPEARANCEUR CLEAR 05/03/2021 1937   LABSPEC 1.020 05/03/2021 1937   PHURINE 8.5 (H) 05/03/2021 1937   GLUCOSEU NEGATIVE  05/03/2021 1937   HGBUR TRACE (A) 05/03/2021 1937   BILIRUBINUR NEGATIVE 05/03/2021 1937   KETONESUR NEGATIVE 05/03/2021 1937   PROTEINUR 30 (A) 05/03/2021 1937   NITRITE NEGATIVE 05/03/2021 1937   LEUKOCYTESUR NEGATIVE 05/03/2021 1937    Radiological Exams on Admission: DG Chest 2 View  Result Date: 05/04/2021 CLINICAL DATA:  Fever, confusion EXAM: CHEST - 2 VIEW COMPARISON:  Chest x-ray 12/22/2011 FINDINGS: The heart and mediastinal contours are unchanged. Aortic calcification. No focal consolidation. Persistent coarsened interstitial markings with no overt pulmonary edema. No pleural effusion. No pneumothorax. No acute osseous abnormality. Multilevel degenerative changes the spine with limited evaluation due to overlying tissues and osseous structures. IMPRESSION: No active cardiopulmonary disease. Electronically Signed   By: Tish Frederickson M.D.   On: 05/04/2021 00:34   CT HEAD WO CONTRAST ( )  Result Date: 05/04/2021 CLINICAL DATA:  Altered mental status.  EXAM: CT HEAD WITHOUT CONTRAST TECHNIQUE: Contiguous axial images were obtained from the base of the skull through the vertex without intravenous contrast. COMPARISON:  None. FINDINGS: Brain: The ventricles and sulci are appropriate size for patient's age. Mild periventricular and deep white matter chronic microvascular ischemic changes noted. There is no acute intracranial hemorrhage. No mass effect or midline shift. No extra-axial fluid collection. Vascular: No hyperdense vessel or unexpected calcification. Skull: Normal. Negative for fracture or focal lesion. Sinuses/Orbits: No acute finding. Other: None IMPRESSION: 1. No acute intracranial pathology. 2. Mild chronic microvascular ischemic changes. Electronically Signed   By: Elgie Collard M.D.   On: 05/04/2021 00:14    Assessment/Plan    Acute confusion - Improving, suspect secondary to fever - Neurochecks every 4 - Fall and aspiration precautions    Respiratory tract  infection, likely viral - COVID and flu negative - Symptom management    Hypertension - Continue home lisinopril HCTZ    DVT prophylaxis: Lovenox  Code Status: full code  Family Communication:  none  Disposition Plan: Back to previous home environment Consults called: none  Status: Observation   Andris Baumann MD [facility]   05/04/2021, 3:15 AM

## 2021-05-04 NOTE — ED Provider Notes (Signed)
[facility] Emergency Department Provider Note  ____________________________________________   Event Date/Time   First MD Initiated Contact with Patient 05/03/21 2336     (approximate)  I have reviewed the triage vital signs and the nursing notes.   HISTORY  Chief Complaint Altered Mental Status (Pt was at home today on his tractor, came in watched some TV, took a nap, when woke up from nap was confused, couldn't think what channel to go to.  Wife was asking questions that was confusing to him, then all of a sudden people were standing over him. )    HPI DEMETRICS UREN is a 87 y.o. male with history of hypertension, hyperlipidemia who presents to the emergency department with his wife for altered mental status.  Patient states that he was doing yard work today and when he came in around 4 PM with she states he was very confused.  She states that he had a hard time telling her what channel he wanted to watch on the television and what was happening.  She states he was "rambling".  She states that this has improved since arriving to the ED.  He states he has had chills and has had a scratchy throat.  No known fevers, head injury, loss of consciousness, chest pain or shortness of breath, cough, vomiting or diarrhea, numbness, tingling or focal weakness.  No changes in any medications.        Past Medical History:  Diagnosis Date   Barrett esophagus    GERD (gastroesophageal reflux disease)    High cholesterol    Hypertension     Patient Active Problem List   Diagnosis Date Noted   Acute confusion 05/04/2021   Hypertension    Acute metabolic encephalopathy    Respiratory tract infection    1st degree AV block    Near syncope 04/05/2018    Past Surgical History:  Procedure Laterality Date   APPENDECTOMY     COLONOSCOPY  08/08/03, 12/05/08 & 12/14/13   ESOPHAGOGASTRODUODENOSCOPY  10/09/09, 12/25/09 & 03/24/12   ESOPHAGOGASTRODUODENOSCOPY (EGD) WITH  PROPOFOL N/A 07/17/2017   Procedure: ESOPHAGOGASTRODUODENOSCOPY (EGD) WITH PROPOFOL;  Surgeon: Christena Deem, MD;  Location: [facility] ENDOSCOPY;  Service: Endoscopy;  Laterality: N/A;   TONSILLECTOMY      Prior to Admission medications   Medication Sig Start Date End Date Taking? Authorizing Provider  amLODipine-atorvastatin (CADUET) 5-20 MG per tablet Take 1 tablet by mouth daily.    [provider]  aspirin EC 81 MG tablet Take 81 mg by mouth daily.    [provider]  cetirizine (ZYRTEC) 10 MG tablet Take 10 mg by mouth daily.    [provider]  fluticasone (FLONASE) 50 MCG/ACT nasal spray Place 2 sprays into both nostrils daily. 04/03/18   [provider]  lisinopril-hydrochlorothiazide (PRINZIDE,ZESTORETIC) 20-12.5 MG per tablet Take 1 tablet by mouth daily.    [provider]  montelukast (SINGULAIR) 10 MG tablet Take 10 mg by mouth at bedtime. 04/03/18   [provider]  omeprazole (PRILOSEC) 20 MG capsule Take 20 mg by mouth daily.    [provider]    Allergies Patient has no known allergies.  History reviewed. No pertinent family history.  Social History Social History   Tobacco Use   Smoking status: Former    Types: Pipe, Cigars    Quit date: 1965    Years since quitting: 57.9   Smokeless tobacco: Never  Substance Use Topics   Alcohol use:  No   Drug use: No    Review of Systems Constitutional: No fever.  + Chills. Eyes: No visual changes. ENT: No sore throat. Cardiovascular: Denies chest pain. Respiratory: Denies shortness of breath. Gastrointestinal: No nausea, vomiting, diarrhea. Genitourinary: Negative for dysuria. Musculoskeletal: Negative for back pain. Skin: Negative for rash. Neurological: Negative for focal weakness or numbness.  ____________________________________________   PHYSICAL EXAM:  VITAL SIGNS: ED Triage Vitals  Enc Vitals Group     BP 05/03/21 1943 126/87     Pulse  Rate 05/03/21 1943 87     Resp 05/03/21 1943 18     Temp 05/03/21 1943 98.4 F (36.9 C)     Temp Source 05/03/21 1943 Oral     SpO2 05/03/21 1943 96 %     Weight 05/03/21 1946 185 lb (83.9 kg)     Height 05/03/21 1946 5\' 11"  (1.803 m)     Head Circumference --      Peak Flow --      Pain Score 05/03/21 1946 0     Pain Loc --      Pain Edu? --      Excl. in GC? --    CONSTITUTIONAL: Alert and oriented x 3 and responds appropriately to questions. Well-appearing; well-nourished, elderly, nontoxic HEAD: Normocephalic EYES: Conjunctivae clear, pupils appear equal, EOM appear intact ENT: normal nose; moist mucous membranes NECK: Supple, normal ROM CARD: RRR; S1 and S2 appreciated; no murmurs, no clicks, no rubs, no gallops RESP: Normal chest excursion without splinting or tachypnea; breath sounds clear and equal bilaterally; no wheezes, no rhonchi, no rales, no hypoxia or respiratory distress, speaking full sentences ABD/GI: Normal bowel sounds; non-distended; soft, non-tender, no rebound, no guarding, no peritoneal signs, no hepatosplenomegaly BACK: The back appears normal EXT: Normal ROM in all joints; no deformity noted, no edema; no cyanosis SKIN: Normal color for age and race; warm; no rash on exposed skin NEURO: Moves all extremities equally, no drift, sensation to light touch intact diffusely, cranial nerves II through XII intact, normal speech PSYCH: The patient's mood and manner are appropriate.  ____________________________________________   LABS (all labs ordered are listed, but only abnormal results are displayed)  Labs Reviewed  COMPREHENSIVE METABOLIC PANEL - Abnormal; Notable for the following components:      Result Value   Glucose, Bld 110 (*)    Total Protein 8.4 (*)    All other components within normal limits  URINALYSIS, ROUTINE W REFLEX MICROSCOPIC - Abnormal; Notable for the following components:   pH 8.5 (*)    Hgb urine dipstick TRACE (*)    Protein, ur  30 (*)    All other components within normal limits  CBG MONITORING, ED - Abnormal; Notable for the following components:   Glucose-Capillary 118 (*)    All other components within normal limits  RESP PANEL BY RT-PCR (FLU A&B, COVID) ARPGX2  CULTURE, BLOOD (ROUTINE X 2)  CULTURE, BLOOD (ROUTINE X 2)  URINE CULTURE  RESPIRATORY PANEL BY PCR  CBC  URINALYSIS, MICROSCOPIC (REFLEX)  LACTIC ACID, PLASMA  PROCALCITONIN  CBC  CREATININE, SERUM   ____________________________________________  EKG   EKG Interpretation  Date/Time:  Friday May 04 2021 02:08:09 EST Ventricular Rate:  76 PR Interval:  404 QRS Duration: 88 QT Interval:  368 QTC Calculation: 414 R Axis:   -9 Text Interpretation: Sinus rhythm with 1st degree A-V block with Premature supraventricular complexes Otherwise normal ECG Confirmed by Benard Minturn, 07-06-2000 [phone]) on 05/04/2021 2:14:56 AM  ____________________________________________  RADIOLOGY Normajean Baxter Reygan Heagle, personally viewed and evaluated these images (plain radiographs) as part of my medical decision making, as well as reviewing the written report by the radiologist.  ED MD interpretation: CT head shows no acute abnormality.  Chest x-ray clear.  Official radiology report(s): DG Chest 2 View  Result Date: 05/04/2021 CLINICAL DATA:  Fever, confusion EXAM: CHEST - 2 VIEW COMPARISON:  Chest x-ray 12/22/2011 FINDINGS: The heart and mediastinal contours are unchanged. Aortic calcification. No focal consolidation. Persistent coarsened interstitial markings with no overt pulmonary edema. No pleural effusion. No pneumothorax. No acute osseous abnormality. Multilevel degenerative changes the spine with limited evaluation due to overlying tissues and osseous structures. IMPRESSION: No active cardiopulmonary disease. Electronically Signed   By: Tish Frederickson M.D.   On: 05/04/2021 00:34   CT HEAD WO CONTRAST ( )  Result Date: 05/04/2021 CLINICAL DATA:   Altered mental status. EXAM: CT HEAD WITHOUT CONTRAST TECHNIQUE: Contiguous axial images were obtained from the base of the skull through the vertex without intravenous contrast. COMPARISON:  None. FINDINGS: Brain: The ventricles and sulci are appropriate size for patient's age. Mild periventricular and deep white matter chronic microvascular ischemic changes noted. There is no acute intracranial hemorrhage. No mass effect or midline shift. No extra-axial fluid collection. Vascular: No hyperdense vessel or unexpected calcification. Skull: Normal. Negative for fracture or focal lesion. Sinuses/Orbits: No acute finding. Other: None IMPRESSION: 1. No acute intracranial pathology. 2. Mild chronic microvascular ischemic changes. Electronically Signed   By: Elgie Collard M.D.   On: 05/04/2021 00:14    ____________________________________________   PROCEDURES  Procedure(s) performed (including Critical Care):  Procedures  CRITICAL CARE Performed by: Baxter Hire Francis Yardley   Total critical care time: 45 minutes  Critical care time was exclusive of separately billable procedures and treating other patients.  Critical care was necessary to treat or prevent imminent or life-threatening deterioration.  Critical care was time spent personally by me on the following activities: development of treatment plan with patient and/or surrogate as well as nursing, discussions with consultants, evaluation of patient's response to treatment, examination of patient, obtaining history from patient or surrogate, ordering and performing treatments and interventions, ordering and review of laboratory studies, ordering and review of radiographic studies, pulse oximetry and re-evaluation of patient's condition.  ____________________________________________   INITIAL IMPRESSION / ASSESSMENT AND PLAN / ED COURSE  As part of my medical decision making, I reviewed the following data within the electronic MEDICAL RECORD NUMBER History  obtained from family, Nursing notes reviewed and incorporated, Labs reviewed , EKG interpreted , Old EKG reviewed, Old chart reviewed, Radiograph reviewed , Discussed with admitting physician , CT reviewed, and Notes from prior ED visits         Patient here with altered mental status.  This seems to have improved and is currently neurologically intact.  On my exam he has chills and is very warm to touch.  I suspect that he has a fever.  We will check rectal temperature.  Labs so far reassuring.  Urine shows no sign of infection.  Will obtain COVID and flu swabs, blood cultures, lactate, procalcitonin.  Will obtain chest x-ray to evaluate for pneumonia.  Will obtain head CT for altered mental status.  Low suspicion for stroke, intracranial hemorrhage.  ED PROGRESS  CT head shows no acute abnormality.  Chest x-ray clear.  Rectal temp is 102.  Will give Tylenol.  Suspect that fever is likely the cause of his delirium.  Procalcitonin and lactic  normal.  Seems more likely to be a viral illness rather than bacterial in nature.  He has no headache, meningismus.  No chest pain or shortness of breath.  No abdominal pain, vomiting or diarrhea.  No urinary symptoms.   Patient continues to be hemodynamically stable.  No signs of any bacterial infection.  Flu and COVID-negative.  Respiratory viral panel pending.  Will discuss with hospitalist for admission for altered mental status, fever.  Discussed patient's case with hospitalist, Dr. Para March.  I have recommended admission and patient (and family if present) agree with this plan. Admitting physician will place admission orders.   I reviewed all nursing notes, vitals, pertinent previous records and reviewed/interpreted all EKGs, lab and urine results, imaging (as available).  ____________________________________________   FINAL CLINICAL IMPRESSION(S) / ED DIAGNOSES  Final diagnoses:  Altered mental status, unspecified altered mental status type  Fever,  unspecified fever cause     ED Discharge Orders     None       *Please note:  GRESHAM CAETANO was evaluated in Emergency Department on 05/04/2021 for the symptoms described in the history of present illness. He was evaluated in the context of the global COVID-19 pandemic, which necessitated consideration that the patient might be at risk for infection with the SARS-CoV-2 virus that causes COVID-19. Institutional protocols and algorithms that pertain to the evaluation of patients at risk for COVID-19 are in a state of rapid change based on information released by regulatory bodies including the CDC and federal and state organizations. These policies and algorithms were followed during the patient's care in the ED.  Some ED evaluations and interventions may be delayed as a result of limited staffing during and the pandemic.*   Note:  This document was prepared using Dragon voice recognition software and may include unintentional dictation errors.    Harvir Patry, Layla Maw, DO 05/04/21 [phone]

## 2021-05-04 NOTE — ED Notes (Signed)
Report given to Rashida, RN

## 2021-05-04 NOTE — ED Notes (Signed)
Patient placed in gown and assisted to the restroom.

## 2021-05-05 LAB — URINE CULTURE: Culture: NO GROWTH

## 2021-05-09 LAB — CULTURE, BLOOD (ROUTINE X 2)
Culture: NO GROWTH
Culture: NO GROWTH
Special Requests: ADEQUATE

## 2021-11-14 ENCOUNTER — Emergency Department: Payer: Medicare Other

## 2021-11-14 ENCOUNTER — Observation Stay
Admission: EM | Admit: 2021-11-14 | Discharge: 2021-11-15 | Disposition: A | Discharge status: Home / Self Care | Payer: Medicare Other | Attending: Osteopathic Medicine | Admitting: Osteopathic Medicine

## 2021-11-14 ENCOUNTER — Observation Stay: Payer: Medicare Other

## 2021-11-14 ENCOUNTER — Other Ambulatory Visit: Payer: Self-pay

## 2021-11-14 ENCOUNTER — Encounter: Payer: Self-pay | Admitting: Medical Oncology

## 2021-11-14 DIAGNOSIS — I10 Essential (primary) hypertension: Secondary | ICD-10-CM | POA: Diagnosis not present

## 2021-11-14 DIAGNOSIS — R479 Unspecified speech disturbances: Secondary | ICD-10-CM

## 2021-11-14 DIAGNOSIS — Z87891 Personal history of nicotine dependence: Secondary | ICD-10-CM | POA: Diagnosis not present

## 2021-11-14 DIAGNOSIS — R9089 Other abnormal findings on diagnostic imaging of central nervous system: Secondary | ICD-10-CM

## 2021-11-14 DIAGNOSIS — R4701 Aphasia: Principal | ICD-10-CM | POA: Insufficient documentation

## 2021-11-14 DIAGNOSIS — Z79899 Other long term (current) drug therapy: Secondary | ICD-10-CM | POA: Insufficient documentation

## 2021-11-14 DIAGNOSIS — R4781 Slurred speech: Secondary | ICD-10-CM | POA: Diagnosis present

## 2021-11-14 DIAGNOSIS — Z20822 Contact with and (suspected) exposure to covid-19: Secondary | ICD-10-CM | POA: Insufficient documentation

## 2021-11-14 DIAGNOSIS — K2270 Barrett's esophagus without dysplasia: Secondary | ICD-10-CM | POA: Diagnosis present

## 2021-11-14 DIAGNOSIS — R42 Dizziness and giddiness: Secondary | ICD-10-CM | POA: Diagnosis not present

## 2021-11-14 LAB — COMPREHENSIVE METABOLIC PANEL
ALT: 15 U/L (ref 0–44)
AST: 31 U/L (ref 15–41)
Albumin: 4.4 g/dL (ref 3.5–5.0)
Alkaline Phosphatase: 70 U/L (ref 38–126)
Anion gap: 9 (ref 5–15)
BUN: 27 mg/dL — ABNORMAL HIGH (ref 8–23)
CO2: 27 mmol/L (ref 22–32)
Calcium: 9.8 mg/dL (ref 8.9–10.3)
Chloride: 99 mmol/L (ref 98–111)
Creatinine, Ser: 1.21 mg/dL (ref 0.61–1.24)
GFR, Estimated: 57 mL/min — ABNORMAL LOW (ref >60)
Glucose, Bld: 135 mg/dL — ABNORMAL HIGH (ref 70–99)
Potassium: 4.3 mmol/L (ref 3.5–5.1)
Sodium: 135 mmol/L (ref 135–145)
Total Bilirubin: 1.9 mg/dL — ABNORMAL HIGH (ref 0.3–1.2)
Total Protein: 8.1 g/dL (ref 6.5–8.1)

## 2021-11-14 LAB — URINALYSIS, ROUTINE W REFLEX MICROSCOPIC
Bilirubin Urine: NEGATIVE
Glucose, UA: NEGATIVE mg/dL
Hgb urine dipstick: NEGATIVE
Ketones, ur: 5 mg/dL — AB
Leukocytes,Ua: NEGATIVE
Nitrite: NEGATIVE
Protein, ur: 100 mg/dL — AB
Specific Gravity, Urine: 1.019 (ref 1.005–1.030)
pH: 5 (ref 5.0–8.0)

## 2021-11-14 LAB — CBC WITH DIFFERENTIAL/PLATELET
Abs Immature Granulocytes: 0.03 10*3/uL (ref 0.00–0.07)
Basophils Absolute: 0 10*3/uL (ref 0.0–0.1)
Basophils Relative: 0 %
Eosinophils Absolute: 0 10*3/uL (ref 0.0–0.5)
Eosinophils Relative: 0 %
HCT: 44.4 % (ref 39.0–52.0)
Hemoglobin: 15.2 g/dL (ref 13.0–17.0)
Immature Granulocytes: 0 %
Lymphocytes Relative: 14 %
Lymphs Abs: 1.4 10*3/uL (ref 0.7–4.0)
MCH: 33.1 pg (ref 26.0–34.0)
MCHC: 34.2 g/dL (ref 30.0–36.0)
MCV: 96.7 fL (ref 80.0–100.0)
Monocytes Absolute: 0.8 10*3/uL (ref 0.1–1.0)
Monocytes Relative: 8 %
Neutro Abs: 7.6 10*3/uL (ref 1.7–7.7)
Neutrophils Relative %: 78 %
Platelets: 193 10*3/uL (ref 150–400)
RBC: 4.59 MIL/uL (ref 4.22–5.81)
RDW: 12.4 % (ref 11.5–15.5)
WBC: 10 10*3/uL (ref 4.0–10.5)
nRBC: 0 % (ref 0.0–0.2)

## 2021-11-14 LAB — SARS CORONAVIRUS 2 BY RT PCR: SARS Coronavirus 2 by RT PCR: NEGATIVE

## 2021-11-14 LAB — TROPONIN I (HIGH SENSITIVITY): Troponin I (High Sensitivity): 36 ng/L — ABNORMAL HIGH (ref <18)

## 2021-11-14 MED ORDER — SODIUM CHLORIDE 0.9 % IV BOLUS
1000.0000 mL | Freq: Once | INTRAVENOUS | Status: AC
  Administered 2021-11-14: 1000 mL via INTRAVENOUS

## 2021-11-14 MED ORDER — LORAZEPAM 2 MG/ML IJ SOLN
0.5000 mg | Freq: Once | INTRAMUSCULAR | Status: AC
Start: 2021-11-14 — End: 2021-11-14
  Administered 2021-11-14: 0.5 mg via INTRAVENOUS
  Filled 2021-11-14: qty 1

## 2021-11-14 MED ORDER — HYDROCHLOROTHIAZIDE 12.5 MG PO TABS
12.5000 mg | ORAL_TABLET | Freq: Every day | ORAL | Status: DC
  Administered 2021-11-15: 12.5 mg via ORAL
  Filled 2021-11-14: qty 1

## 2021-11-14 MED ORDER — LISINOPRIL 20 MG PO TABS
20.0000 mg | ORAL_TABLET | Freq: Every day | ORAL | Status: DC
  Administered 2021-11-15: 20 mg via ORAL
  Filled 2021-11-14: qty 1

## 2021-11-14 MED ORDER — ASPIRIN 81 MG PO TBEC
81.0000 mg | DELAYED_RELEASE_TABLET | Freq: Once | ORAL | Status: AC
  Administered 2021-11-14: 81 mg via ORAL
  Filled 2021-11-14: qty 1

## 2021-11-14 MED ORDER — ATORVASTATIN CALCIUM 20 MG PO TABS
40.0000 mg | ORAL_TABLET | Freq: Once | ORAL | Status: AC
  Administered 2021-11-14: 40 mg via ORAL
  Filled 2021-11-14: qty 2

## 2021-11-14 MED ORDER — ACETAMINOPHEN 160 MG/5ML PO SOLN: 650.0000 mg | ORAL | Status: DC | PRN

## 2021-11-14 MED ORDER — ASPIRIN 81 MG PO TBEC
81.0000 mg | DELAYED_RELEASE_TABLET | Freq: Every day | ORAL | Status: DC
  Administered 2021-11-15: 81 mg via ORAL
  Filled 2021-11-14: qty 1

## 2021-11-14 MED ORDER — LISINOPRIL-HYDROCHLOROTHIAZIDE 20-12.5 MG PO TABS
1.0000 | ORAL_TABLET | Freq: Every day | ORAL | Status: DC
Start: 2021-11-14 — End: 2021-11-14

## 2021-11-14 MED ORDER — HEPARIN SODIUM (PORCINE) 5000 UNIT/ML IJ SOLN
5000.0000 [IU] | Freq: Three times a day (TID) | INTRAMUSCULAR | Status: DC
  Administered 2021-11-14: 5000 [IU] via SUBCUTANEOUS
  Filled 2021-11-14 (×2): qty 1

## 2021-11-14 MED ORDER — ACETAMINOPHEN 650 MG RE SUPP: 650.0000 mg | RECTAL | Status: DC | PRN

## 2021-11-14 MED ORDER — PANTOPRAZOLE SODIUM 40 MG IV SOLR
40.0000 mg | Freq: Two times a day (BID) | INTRAVENOUS | Status: DC
  Administered 2021-11-15 (×2): 40 mg via INTRAVENOUS
  Filled 2021-11-14 (×2): qty 10

## 2021-11-14 MED ORDER — IOHEXOL 350 MG/ML SOLN
75.0000 mL | Freq: Once | INTRAVENOUS | Status: AC | PRN
  Administered 2021-11-14: 75 mL via INTRAVENOUS

## 2021-11-14 MED ORDER — STROKE: EARLY STAGES OF RECOVERY BOOK: Freq: Once | Status: AC

## 2021-11-14 MED ORDER — ACETAMINOPHEN 325 MG PO TABS: 650.0000 mg | ORAL_TABLET | ORAL | Status: DC | PRN

## 2021-11-14 MED ORDER — SODIUM CHLORIDE 0.9 % IV SOLN
2.0000 g | Freq: Once | INTRAVENOUS | Status: AC
  Administered 2021-11-14: 2 g via INTRAVENOUS
  Filled 2021-11-14: qty 20

## 2021-11-14 NOTE — ED Provider Triage Note (Signed)
  Emergency Medicine Provider Triage Evaluation Note  Carlos Frey , a 88 y.o.male,  was evaluated in triage.  Pt complains of altered mental status and weakness x3 days.  Patient's wife states that he has not been eating/drinking well either.  He is not endorsing any pain at this time.  He does state that he was bitten by a tick on his foot earlier in the week on his foot and is unsure if this has anything to do with that.   Review of Systems  Positive: AMS, weakness Negative: Denies fever, chest pain, vomiting  Physical Exam   Vitals:   11/14/21 1112  BP: 121/66  Pulse: 100  Resp: 18  Temp: 97.6 F (36.4 C)  SpO2: 99%   Gen:   Awake, no distress   Resp:  Normal effort  MSK:   Moves extremities without difficulty  Other:  Unable to tell me what year it is or what his age is, though does know that he is in the hospital.  Medical Decision Making  Given the patient's initial medical screening exam, the following diagnostic evaluation has been ordered. The patient will be placed in the appropriate treatment space, once one is available, to complete the evaluation and treatment. I have discussed the plan of care with the patient and I have advised the patient that an ED physician or mid-level practitioner will reevaluate their condition after the test results have been received, as the results may give them additional insight into the type of treatment they may need.    Diagnostics: Labs, EKG, CXR, head CT, UA  Treatments: none immediately   Varney Daily, [place] 11/14/21 1115

## 2021-11-14 NOTE — Assessment & Plan Note (Addendum)
MRI shows chronic microvascular changes for patient.   TIA presentation. Suspect intermittent orthostasis predisposing to these kind of symptoms and hypoperfusion. HCTZ 12.5, decrease lisinopril to 10 mg.  Discontinue amlodipine. Currently patient's symptoms are resolved.  We will continue patient on atorvastatin and aspirin.

## 2021-11-14 NOTE — Assessment & Plan Note (Addendum)
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 daily. Discontinue amlodipine.

## 2021-11-14 NOTE — H&P (Signed)
History and Physical    Patient: Carlos Frey:[id number] DOB: March 26, 1930 DOA: 11/14/2021 DOS: the patient was seen and examined on 11/14/2021 PCP: [name], MD  Patient coming from: Home  Chief Complaint:  Chief Complaint  Patient presents with   Altered Mental Status   HPI: Carlos Frey is a 88 y.o. male with medical history significant of GERD, Barrett's esophagus, history of tobacco abuse coming with complaints of altered mental status and inability to speak and slurred speech. Patient has been having intermittent episodes that last about an hour or so and resolve has been getting worse in the past 2 to 3 days.  On my exam patient states that he is now able to speak which he was unable to do earlier today.  Patient reports that he had a similar episode in November.  Patient states he had this reported dizziness and lightheadedness symptoms that were there in November and currently. Patient today does not blurred vision shortness of breath chest pain palpitations nausea vomiting diarrhea fevers chills abdominal complaints or any bladder issues.  Review of Systems: As mentioned in the history of present illness. All other systems reviewed and are negative. Past Medical History:  Diagnosis Date   Barrett esophagus    GERD (gastroesophageal reflux disease)    High cholesterol    Hypertension    Past Surgical History:  Procedure Laterality Date   APPENDECTOMY     COLONOSCOPY  08/08/03, 12/05/08 & 12/14/13   ESOPHAGOGASTRODUODENOSCOPY  10/09/09, 12/25/09 & 03/24/12   ESOPHAGOGASTRODUODENOSCOPY (EGD) WITH PROPOFOL N/A 07/17/2017   Procedure: ESOPHAGOGASTRODUODENOSCOPY (EGD) WITH PROPOFOL;  Surgeon: Christena Deem, MD;  Location: [facility];  Service: Endoscopy;  Laterality: N/A;   TONSILLECTOMY     Social History:  reports that he quit smoking about 58 years ago. His smoking use included pipe and cigars. He has never used smokeless tobacco. He reports that he does not  drink alcohol and does not use drugs.  No Known Allergies  History reviewed. No pertinent family history.  Prior to Admission medications   Medication Sig Start Date End Date Taking? Authorizing Provider  amLODipine-atorvastatin (CADUET) 5-20 MG per tablet Take 1 tablet by mouth daily.   Yes [provider]  cetirizine (ZYRTEC) 10 MG tablet Take 10 mg by mouth daily.   Yes [provider]  lisinopril-hydrochlorothiazide (PRINZIDE,ZESTORETIC) 20-12.5 MG per tablet Take 1 tablet by mouth daily.   Yes [provider]  omeprazole (PRILOSEC) 20 MG capsule Take 20 mg by mouth daily.   Yes [provider]  aspirin EC 81 MG tablet Take 81 mg by mouth daily. Patient not taking: Reported on 11/14/2021    [provider]  fluticasone (FLONASE) 50 MCG/ACT nasal spray Place 2 sprays into both nostrils daily. Patient not taking: Reported on 11/14/2021 04/03/18   [provider]  montelukast (SINGULAIR) 10 MG tablet Take 10 mg by mouth at bedtime. Patient not taking: Reported on 11/14/2021 04/03/18   [provider]    Physical Exam: Vitals:   11/14/21 1700 11/14/21 1815 11/14/21 2033 11/14/21 2122  BP: (!) 128/44 137/67 (!) 126/59 (!) 128/56  Pulse: (!) 55 (!) 53 63 62  Resp: (!) Temp:   98.2 F (36.8 C) 98 F (36.7 C)  TempSrc:   Oral   SpO2: 98% 96% 97% 96%  Weight:      Height:       Physical Exam Vitals and nursing note reviewed.  Constitutional:  General: He is not in acute distress.    Appearance: Normal appearance. He is not ill-appearing, toxic-appearing or diaphoretic.  HENT:     Head: Normocephalic and atraumatic.     Right Ear: Hearing and external ear normal.     Left Ear: Hearing and external ear normal.     Nose: Nose normal. No nasal deformity.     Mouth/Throat:     Lips: Pink.     Mouth: Mucous membranes are moist.     Tongue: No lesions.     Pharynx: Oropharynx is clear.  Eyes:      Extraocular Movements: Extraocular movements intact.     Pupils: Pupils are equal, round, and reactive to light.  Neck:     Vascular: No carotid bruit.  Cardiovascular:     Rate and Rhythm: Normal rate and regular rhythm.     Pulses: Normal pulses.     Heart sounds: Normal heart sounds.  Pulmonary:     Effort: Pulmonary effort is normal.     Breath sounds: Normal breath sounds.  Abdominal:     General: Bowel sounds are normal. There is no distension.     Palpations: Abdomen is soft. There is no mass.     Tenderness: There is no abdominal tenderness. There is no guarding.     Hernia: No hernia is present.  Musculoskeletal:     Right lower leg: No edema.     Left lower leg: No edema.  Skin:    General: Skin is warm.  Neurological:     General: No focal deficit present.     Mental Status: He is alert and oriented to person, place, and time.     Cranial Nerves: Cranial nerves 2-12 are intact.     Motor: Motor function is intact.  Psychiatric:        Attention and Perception: Attention normal.        Mood and Affect: Mood normal.        Speech: Speech normal.        Behavior: Behavior normal. Behavior is cooperative.        Cognition and Memory: Cognition normal.    Data Reviewed: Results for orders placed or performed during the hospital encounter of 11/14/21 (from the past 24 hour(s))  Comprehensive metabolic panel     Status: Abnormal   Collection Time: 11/14/21 11:19 AM  Result Value Ref Range   Sodium 135 135 - 145 mmol/L   Potassium 4.3 3.5 - 5.1 mmol/L   Chloride 99 98 - 111 mmol/L   CO2 27 22 - 32 mmol/L   Glucose, Bld 135 (H) 70 - 99 mg/dL   BUN 27 (H) 8 - 23 mg/dL   Creatinine, Ser 7.67 0.61 - 1.24 mg/dL   Calcium 9.8 8.9 - 34.1 mg/dL   Total Protein 8.1 6.5 - 8.1 g/dL   Albumin 4.4 3.5 - 5.0 g/dL   AST 31 15 - 41 U/L   ALT 15 0 - 44 U/L   Alkaline Phosphatase 70 38 - 126 U/L   Total Bilirubin 1.9 (H) 0.3 - 1.2 mg/dL   GFR, Estimated 57 (L) >60 mL/min    Anion gap 9 5 - 15  CBC with Differential     Status: None   Collection Time: 11/14/21 11:19 AM  Result Value Ref Range   WBC 10.0 4.0 - 10.5 K/uL   RBC 4.59 4.22 - 5.81 MIL/uL   Hemoglobin 15.2 13.0 - 17.0 g/dL   HCT 44.4  39.0 - 52.0 %   MCV 96.7 80.0 - 100.0 fL   MCH 33.1 26.0 - 34.0 pg   MCHC 34.2 30.0 - 36.0 g/dL   RDW 83.4 19.6 - 22.2 %   Platelets 193 150 - 400 K/uL   nRBC 0.0 0.0 - 0.2 %   Neutrophils Relative % 78 %   Neutro Abs 7.6 1.7 - 7.7 K/uL   Lymphocytes Relative 14 %   Lymphs Abs 1.4 0.7 - 4.0 K/uL   Monocytes Relative 8 %   Monocytes Absolute 0.8 0.1 - 1.0 K/uL   Eosinophils Relative 0 %   Eosinophils Absolute 0.0 0.0 - 0.5 K/uL   Basophils Relative 0 %   Basophils Absolute 0.0 0.0 - 0.1 K/uL   Immature Granulocytes 0 %   Abs Immature Granulocytes 0.03 0.00 - 0.07 K/uL  Troponin I (High Sensitivity)     Status: Abnormal   Collection Time: 11/14/21 11:19 AM  Result Value Ref Range   Troponin I (High Sensitivity) 36 (H) <18 ng/L  Urinalysis, Routine w reflex microscopic Urine, Clean Catch     Status: Abnormal   Collection Time: 11/14/21  3:58 PM  Result Value Ref Range   Color, Urine AMBER (A) YELLOW   APPearance CLOUDY (A) CLEAR   Specific Gravity, Urine 1.019 1.005 - 1.030   pH 5.0 5.0 - 8.0   Glucose, UA NEGATIVE NEGATIVE mg/dL   Hgb urine dipstick NEGATIVE NEGATIVE   Bilirubin Urine NEGATIVE NEGATIVE   Ketones, ur 5 (A) NEGATIVE mg/dL   Protein, ur 979 (A) NEGATIVE mg/dL   Nitrite NEGATIVE NEGATIVE   Leukocytes,Ua NEGATIVE NEGATIVE   RBC / HPF 0-5 0 - 5 RBC/hpf   WBC, UA 6-10 0 - 5 WBC/hpf   Bacteria, UA RARE (A) NONE SEEN   Squamous Epithelial / LPF 0-5 0 - 5   Mucus PRESENT    Hyaline Casts, UA PRESENT   SARS Coronavirus 2 by RT PCR (hospital order, performed in [facility] hospital lab) *cepheid single result test* Anterior Nasal Swab     Status: None   Collection Time: 11/14/21  3:58 PM   Specimen: Anterior Nasal Swab  Result Value Ref Range    SARS Coronavirus 2 by RT PCR NEGATIVE NEGATIVE     Assessment and Plan: * Aphasia MRI shows chronic microvascular changes for patient.  This may be a TIA presentation. I also suspect intermittent orthostasis predisposing to these kind of symptoms and hypoperfusion. Current medication regimen includes Zestoretic along with amlodipine. I will continue patient on HCTZ 12.5, decrease lisinopril to 10 mg.  Discontinue amlodipine. Suspect this will raise his pressures to 130s which I suspect he will need as his baseline. Currently patient's symptoms are resolved.  We will continue patient on atorvastatin and aspirin. Orthostatic vitals pending. Compression stockings. Physical therapy consult.   Dizziness       Orthostatic vitals. Fall precautions. Observe and monitor overnight with telemetry for any dysrhythmias. ekg sinus with pr interval of 427. Patient's PR interval has progressed from 2019 to current on not sure if this is contributing to his dizziness history of presyncope. Patient's last echocardiogram was in 2019 and we will obtain a new 1 with a bubble study as we are also concerned about TIAs. We we will request cardiology consult for evaluation of prolonged PR interval history of presyncope and aphasia presentation today. Not sure if patient needs to be considered for pacemaker.  2 D Echo 2019: - Left ventricle: The cavity size  was normal. Wall thickness was    increased in a pattern of mild LVH. There was moderate focal    basal hypertrophy. Systolic function was normal. The estimated    ejection fraction was in the range of 55% to 65%. Doppler    parameters are consistent with abnormal left ventricular    relaxation (grade 1 diastolic dysfunction). Doppler parameters    are consistent with high ventricular filling pressure.  - Aortic valve: Calcified annulus. Probably trileaflet; mildly    thickened, mildly calcified leaflets. Transvalvular velocity was     increased. There was moderate stenosis. There was mild    regurgitation.  - Aortic root: The aortic root was mildly dilated.  - Left atrium: The atrium was mildly dilated.  - Right ventricle: The cavity size was mildly dilated. Wall    thickness was normal. Systolic function was normal.  - Pulmonary arteries: Systolic pressure was at the upper limits of    normal to mildly elevated, estimated to be 30 mm Hg plus central    venous/right atrial pressure.    Barrett's esophagus determined by biopsy IV PPI.   Benign essential hypertension Blood pressure medications tapered and tailored as below. - Continue HCTZ 12.5 p.o. nightly - Decrease lisinopril to 10 mg p.o. daily. -Discontinue amlodipine. Heart healthy diet.    Advance Care Planning:   Code Status: Full Code   Consults:  Cardiology: Dr.O'Neal -[facility] Am message sent.   Family Communication:  Valerie SaltsCarter,Judy (Spouse)  [phone] (Home Phone)  Severity of Illness: The appropriate patient status for this patient is OBSERVATION. Observation status is judged to be reasonable and necessary in order to provide the required intensity of service to ensure the patient's safety. The patient's presenting symptoms, physical exam findings, and initial radiographic and laboratory data in the context of their medical condition is felt to place them at decreased risk for further clinical deterioration. Furthermore, it is anticipated that the patient will be medically stable for discharge from the hospital within 2 midnights of admission.   Author: Gertha CalkinEkta V Ildefonso Keaney, MD 11/14/2021 11:42 PM  For on call review [email or web address].

## 2021-11-14 NOTE — Hospital Course (Signed)
ED signout: TIA eval obs. 89 rec expressive aphasia.  Recognizes he cant speak. Resolved. Recurred several times today/  Similar in past.  ? Infection similar infection.  UTI+ no symptoms.  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  Ekg: Orthostatic vitals x 1.  Troponin -  Ct head negative.  MRI is negative.  ? Smoker.

## 2021-11-14 NOTE — Assessment & Plan Note (Addendum)
Resume home PPI 

## 2021-11-14 NOTE — ED Provider Notes (Signed)
Assumed care at 3 PM.  Briefly, patient is here with recurrent episodes of expressive aphasia.  Interestingly, this has happened before in the setting of infection in the past.  He has had multiple episodes today.  MRI obtained, reviewed, and does show old infarcts but no acute changes.  UA shows possible UTI although he does not have any significant symptoms associated with this.  He does appear mildly dehydrated.  Given the recurrence of a very focal and recurrent expressive aphasia with history of strokes on his MRI, feel he is moderately high risk for TIA and should be admitted for observation.  He has never had carotid duplexes.  Patient in agreement with this plan.   Shaune Pollack, MD 11/14/21 [phone]

## 2021-11-14 NOTE — ED Triage Notes (Signed)
Pt sent from [facility] with reports of altered mental status x 3 days per family. Pt reports generalized weakness x 3 days also. Pt A/O x 2 in triage. Per wife pt has not been eating/drinking well.

## 2021-11-14 NOTE — ED Provider Notes (Signed)
[facility] Provider Note   Event Date/Time   First MD Initiated Contact with Patient 11/14/21 1533     (approximate) History  Altered Mental Status  HPI Carlos Frey is a 88 y.o. male with a stated past medical history of hypertension who presents for multiple episodes of slurred speech.  Per patient and family, he has been having intermittent episodes of approximately 1 hour long of slurred speech after 2 days prior to arrival he woke up from a nap complaining of feeling chilled and was unable to get a full sentence out.  Patient and family states that the symptoms have happened before and patient was admitted to the hospital with a negative work-up.  Of note, the last time this happened patient did have evidence of a fever and possible upper respiratory infection that may have been driving the symptoms.  Patient only complains of mild sore throat at this time. ROS: Patient currently denies any vision changes, tinnitus, difficulty speaking, facial droop, chest pain, shortness of breath, abdominal pain, nausea/vomiting/diarrhea, dysuria, or weakness/numbness/paresthesias in any extremity   Physical Exam  Triage Vital Signs: ED Triage Vitals  Enc Vitals Group     BP 11/14/21 1112 121/66     Pulse Rate 11/14/21 1112 100     Resp 11/14/21 1112 18     Temp 11/14/21 1112 97.6 F (36.4 C)     Temp Source 11/14/21 1112 Oral     SpO2 11/14/21 1112 99 %     Weight 11/14/21 1114 179 lb (81.2 kg)     Height 11/14/21 1114 5\' 11"  (1.803 m)     Head Circumference --      Peak Flow --      Pain Score 11/14/21 1114 0     Pain Loc --      Pain Edu? --      Excl. in GC? --    Most recent vital signs: Vitals:   11/14/21 1112 11/14/21 1430  BP: 121/66 (!) 120/48  Pulse: 100 (!) 56  Resp: 18 18  Temp: 97.6 F (36.4 C)   SpO2: 99% 98%   General: Awake, oriented x4. CV:  Good peripheral perfusion.  Resp:  Normal effort.  Abd:  No distention.  Other:  Elderly  Caucasian male laying in bed in no acute distress.  NIHSS 0 ED Results / Procedures / Treatments  Labs (all labs ordered are listed, but only abnormal results are displayed) Labs Reviewed  COMPREHENSIVE METABOLIC PANEL - Abnormal; Notable for the following components:      Result Value   Glucose, Bld 135 (*)    BUN 27 (*)    Total Bilirubin 1.9 (*)    GFR, Estimated 57 (*)    All other components within normal limits  SARS CORONAVIRUS 2 BY RT PCR  CBC WITH DIFFERENTIAL/PLATELET  URINALYSIS, ROUTINE W REFLEX MICROSCOPIC  CBG MONITORING, ED   EKG ED ECG REPORT I, 01/14/22, the attending physician, personally viewed and interpreted this ECG. Date: 11/14/2021 EKG Time: 1120 Rate: 53 Rhythm: Bradycardic sinus rhythm QRS Axis: normal Intervals: normal ST/T Wave abnormalities: normal Narrative Interpretation: Bradycardic sinus rhythm.  No evidence of acute ischemia RADIOLOGY ED MD interpretation: 2 view chest x-ray interpreted by me shows no evidence of acute abnormalities including no pneumonia, pneumothorax, or widened mediastinum  CT of the head without contrast interpreted by me shows no evidence of acute abnormalities including no intracerebral hemorrhage, obvious masses, or significant edema -Agree with  radiology assessment Official radiology report(s): DG Chest 2 View  Result Date: 11/14/2021 CLINICAL DATA:  Weakness.  Altered mental status for 3 days. EXAM: CHEST - 2 VIEW COMPARISON:  05/04/2021 FINDINGS: Atherosclerotic calcification of the aortic arch. Thoracic spondylosis. Linear subsegmental atelectasis or scarring along the left hemidiaphragm. No blunting of the costophrenic angles. Mild thoracic kyphosis appear stable. IMPRESSION: 1.  No acute cardiopulmonary disease is radiographically apparent. 2.  Aortic Atherosclerosis (ICD10-I70.0). 3. Minimal scarring or atelectasis along the left hemidiaphragm. 4. Thoracic spondylosis. Electronically Signed   By: Gaylyn Rong M.D.   On: 11/14/2021 11:44   CT Head Wo Contrast  Result Date: 11/14/2021 CLINICAL DATA:  Altered mental status for the last 3 days EXAM: CT HEAD WITHOUT CONTRAST TECHNIQUE: Contiguous axial images were obtained from the base of the skull through the vertex without intravenous contrast. RADIATION DOSE REDUCTION: This exam was performed according to the departmental dose-optimization program which includes automated exposure control, adjustment of the mA and/or kV according to patient size and/or use of iterative reconstruction technique. COMPARISON:  05/04/2021 FINDINGS: Brain: The brainstem, cerebellum, cerebral peduncles, thalami, basal ganglia, basilar cisterns, and ventricular system appear within normal limits. Periventricular white matter and corona radiata hypodensities favor chronic ischemic microvascular white matter disease. No intracranial hemorrhage, mass lesion, or acute CVA. Vascular: There is atherosclerotic calcification of the cavernous carotid arteries bilaterally. Atherosclerotic calcification of the vertebral arteries noted. Skull: Unremarkable Sinuses/Orbits: Unremarkable Other: Calcification along the transverse ligament of the atlas. IMPRESSION: 1. No acute intracranial findings. 2. Periventricular white matter and corona radiata hypodensities favor chronic ischemic microvascular white matter disease. 3. Atherosclerosis. Electronically Signed   By: Gaylyn Rong M.D.   On: 11/14/2021 11:52   PROCEDURES: Critical Care performed: Yes, see critical care procedure note(s) .1-3 Lead EKG Interpretation Performed by: Merwyn Katos, MD Authorized by: Merwyn Katos, MD     Interpretation: normal     ECG rate:  62   ECG rate assessment: normal     Rhythm: sinus rhythm     Ectopy: none     Conduction: normal   CRITICAL CARE Performed by: Merwyn Katos  Total critical care time: 31 minutes  Critical care time was exclusive of separately billable procedures and  treating other patients.  Critical care was necessary to treat or prevent imminent or life-threatening deterioration.  Critical care was time spent personally by me on the following activities: development of treatment plan with patient and/or surrogate as well as nursing, discussions with consultants, evaluation of patient's response to treatment, examination of patient, obtaining history from patient or surrogate, ordering and performing treatments and interventions, ordering and review of laboratory studies, ordering and review of radiographic studies, pulse oximetry and re-evaluation of patient's condition.  MEDICATIONS ORDERED IN ED: Medications  sodium chloride 0.9 % bolus 1,000 mL (has no administration in time range)   IMPRESSION / MDM / ASSESSMENT AND PLAN / ED COURSE  I reviewed the triage vital signs and the nursing notes.                             Differential diagnosis includes, but is not limited to, CVA, febrile encephalopathy, metabolic encephalopathy, sepsis The patient is on the cardiac monitor to evaluate for evidence of arrhythmia and/or significant heart rate changes. Patient's presentation is most consistent with acute presentation with potential threat to life or bodily function. Patient is a 53 who presents with symptoms concerning  for TIA PMH risk factors: Hypertension Neurologic Deficits: Slurred speech Last known Well Time: 3 days prior to arrival NIH Stroke Score: 0 Given History and Exam I have lower suspicion for infectious etiology, neurologic changes secondary to toxicologic ingestion, seizure, complex migraine. Presentation concerning for possible stroke requiring workup.  Workup: Labs: POC glucose, CBC, BMP, LFTs, Troponin, PT/INR, PTT, Type and Screen Other Diagnostics: ECG, CXR, non-contrast head CT  Pending: MRI brain  Care of this patient will be signed out to the oncoming physician at the end of my shift.  All pertinent patient information  conveyed and all questions answered.  All further care and disposition decisions will be made by the oncoming physician.    FINAL CLINICAL IMPRESSION(S) / ED DIAGNOSES   Final diagnoses:  Speech disturbance, unspecified type   Rx / DC Orders   ED Discharge Orders     None      Note:  This document was prepared using Dragon voice recognition software and may include unintentional dictation errors.   Merwyn KatosBradler, Charlette Hennings K, MD 11/14/21 [phone]

## 2021-11-14 NOTE — Assessment & Plan Note (Addendum)
Orthostatic dizziness reported No recurrence in hospital Cardiology evaluation completed, no concerns on Echo, known AS Follow w/ cardiology outpatient, may need EP evaluation for pacemaker

## 2021-11-15 ENCOUNTER — Telehealth: Payer: Self-pay | Admitting: *Deleted

## 2021-11-15 ENCOUNTER — Ambulatory Visit: Payer: Self-pay

## 2021-11-15 ENCOUNTER — Observation Stay (HOSPITAL_BASED_OUTPATIENT_CLINIC_OR_DEPARTMENT_OTHER)
Admit: 2021-11-15 | Discharge: 2021-11-15 | Disposition: A | Discharge status: Home / Self Care | Payer: Medicare Other | Attending: Internal Medicine | Admitting: Internal Medicine

## 2021-11-15 DIAGNOSIS — I350 Nonrheumatic aortic (valve) stenosis: Secondary | ICD-10-CM | POA: Diagnosis not present

## 2021-11-15 DIAGNOSIS — G9340 Encephalopathy, unspecified: Secondary | ICD-10-CM | POA: Diagnosis not present

## 2021-11-15 DIAGNOSIS — R4701 Aphasia: Secondary | ICD-10-CM | POA: Diagnosis not present

## 2021-11-15 DIAGNOSIS — G459 Transient cerebral ischemic attack, unspecified: Secondary | ICD-10-CM

## 2021-11-15 DIAGNOSIS — R42 Dizziness and giddiness: Secondary | ICD-10-CM

## 2021-11-15 DIAGNOSIS — R55 Syncope and collapse: Secondary | ICD-10-CM

## 2021-11-15 DIAGNOSIS — R001 Bradycardia, unspecified: Secondary | ICD-10-CM

## 2021-11-15 DIAGNOSIS — I10 Essential (primary) hypertension: Secondary | ICD-10-CM | POA: Diagnosis not present

## 2021-11-15 DIAGNOSIS — R9089 Other abnormal findings on diagnostic imaging of central nervous system: Secondary | ICD-10-CM

## 2021-11-15 DIAGNOSIS — R479 Unspecified speech disturbances: Secondary | ICD-10-CM

## 2021-11-15 LAB — LIPID PANEL
Cholesterol: 157 mg/dL (ref 0–200)
HDL: 28 mg/dL — ABNORMAL LOW (ref >40)
LDL Cholesterol: 97 mg/dL (ref 0–99)
Total CHOL/HDL Ratio: 5.6 RATIO
Triglycerides: 160 mg/dL — ABNORMAL HIGH (ref <150)
VLDL: 32 mg/dL (ref 0–40)

## 2021-11-15 LAB — ECHOCARDIOGRAM COMPLETE BUBBLE STUDY
AR max vel: 0.99 cm2
AV Area VTI: 0.96 cm2
AV Area mean vel: 1.12 cm2
AV Mean grad: 21 mmHg
AV Peak grad: 33.2 mmHg
Ao pk vel: 2.88 m/s
Area-P 1/2: 3.99 cm2
MV VTI: 2.23 cm2
S' Lateral: 2.43 cm

## 2021-11-15 LAB — URINE DRUG SCREEN, QUALITATIVE (ARMC ONLY)
Amphetamines, Ur Screen: NOT DETECTED
Barbiturates, Ur Screen: NOT DETECTED
Benzodiazepine, Ur Scrn: NOT DETECTED
Cannabinoid 50 Ng, Ur ~~LOC~~: NOT DETECTED
Cocaine Metabolite,Ur ~~LOC~~: NOT DETECTED
MDMA (Ecstasy)Ur Screen: NOT DETECTED
Methadone Scn, Ur: NOT DETECTED
Opiate, Ur Screen: NOT DETECTED
Phencyclidine (PCP) Ur S: NOT DETECTED
Tricyclic, Ur Screen: NOT DETECTED

## 2021-11-15 LAB — HEMOGLOBIN A1C
Hgb A1c MFr Bld: 5.5 % (ref 4.8–5.6)
Mean Plasma Glucose: 111.15 mg/dL

## 2021-11-15 LAB — TSH: TSH: 1.861 u[IU]/mL (ref 0.350–4.500)

## 2021-11-15 LAB — TROPONIN I (HIGH SENSITIVITY): Troponin I (High Sensitivity): 23 ng/L — ABNORMAL HIGH (ref <18)

## 2021-11-15 LAB — MAGNESIUM: Magnesium: 2.2 mg/dL (ref 1.7–2.4)

## 2021-11-15 MED ORDER — LISINOPRIL-HYDROCHLOROTHIAZIDE 10-12.5 MG PO TABS: 1.0000 | ORAL_TABLET | Freq: Every day | ORAL | 0 refills | Status: DC

## 2021-11-15 MED ORDER — ATORVASTATIN CALCIUM 40 MG PO TABS: 40.0000 mg | ORAL_TABLET | Freq: Every day | ORAL | 0 refills | Status: DC

## 2021-11-15 NOTE — Telephone Encounter (Signed)
-----   Message from [place], PA-C sent at 11/15/2021 12:49 PM EDT ----- PT needs 2 week live heart monitor for bradycardia with follow-up in 4-5 weeks, after heart monitor.

## 2021-11-15 NOTE — Consult Note (Signed)
Cardiology Consultation:   Patient ID: KMARION RAWL MRN: [id number]; DOB: [birth year]  Admit date: 11/14/2021 Date of Consult: 11/15/2021  PCP:  [name], MD   [facility] Providers Cardiologist:  New  Patient Profile:   Carlos Frey is a 88 y.o. male with a hx of HTN, GERD, Barrett's esophagus, remote tobacco use who is being seen 11/15/2021 for the evaluation of bradycardia at the request of Dr. Lyn Hollingshead.  History of Present Illness:   Carlos Frey has no prior cardiac history. No prior MI or stent. He lives with his wife and is fairly healthy. No prior history of stroke or diabetes. He is on medication for HTN. Not on AV nodal blocking agents at baseline. He has remote smoking history. No alcohol or drug use.   Echo for HTN in 2019 showed LVEF 55-60%, G1DD, basal hypertrophy, mild AI, mildly dilated aortic root, mildly dilated LA and RA, pulmonary arteries with elevated systolic pressure, 30mm Hg.  The patient presented to the ER 11/14/21 for weakness. Started earlier this week and was constant. Not worse with exertion. He wanted to sit or be in bed most of the week. He has occasional dizziness. He denies chest pain, SOB, lower leg edema orthopnea, pnd, palpitations. Per ER note, he was also having intermittent slurred speech and AMS. He reports no recent fever, chills, or illness. Reports eating and drinking normally.   In the ER BP 121/66, RR 18, afebrile, 99%O2. Labs showed BG 135, NA 135, K 4.3, Scr 1.21, BUN 127, WBC 10, Hgb 15.2. UDS negative. CXR unremarkable. CT head without acute abnormalities. CTA head and neck showed b/l carotid bifurcation atherosclerosis wtihout hemodynamically significant stenosis. MRI brain without acute process. HE was admitted for further work-up.   EKG from 2019 showed NSR, 1st degree AV block, 73bpm PRI , PACs EKG from 2022 showed NSR 76bpm with 1st degree AV block PRI 404 ms, PACs  This admission: EKG: SB with first degree AV  block, PRI EKG: Sinus bradycardia 47bpm 1st degree AV block PRI 432 ms Tele shows sinus brady HR upper 40-50s, 1st degree AV block, PRI 440-460ms with sinus pauses, longest 2.78ms   Past Medical History:  Diagnosis Date   Barrett esophagus    GERD (gastroesophageal reflux disease)    High cholesterol    Hypertension     Past Surgical History:  Procedure Laterality Date   APPENDECTOMY     COLONOSCOPY  08/08/03, 12/05/08 & 12/14/13   ESOPHAGOGASTRODUODENOSCOPY  10/09/09, 12/25/09 & 03/24/12   ESOPHAGOGASTRODUODENOSCOPY (EGD) WITH PROPOFOL N/A 07/17/2017   Procedure: ESOPHAGOGASTRODUODENOSCOPY (EGD) WITH PROPOFOL;  Surgeon: Christena Deem, MD;  Location: [facility] ENDOSCOPY;  Service: Endoscopy;  Laterality: N/A;   TONSILLECTOMY       Home Medications:  Prior to Admission medications   Medication Sig Start Date End Date Taking? Authorizing Provider  cetirizine (ZYRTEC) 10 MG tablet Take 10 mg by mouth daily.   Yes [provider]  lisinopril-hydrochlorothiazide (PRINZIDE,ZESTORETIC) 20-12.5 MG per tablet Take 1 tablet by mouth daily.   Yes [provider]  omeprazole (PRILOSEC) 20 MG capsule Take 20 mg by mouth daily.   Yes [provider]  aspirin EC 81 MG tablet Take 81 mg by mouth daily. Patient not taking: Reported on 11/14/2021    [provider]  fluticasone (FLONASE) 50 MCG/ACT nasal spray Place 2 sprays into both nostrils daily. Patient not taking: Reported on 11/14/2021 04/03/18   [provider]  montelukast (SINGULAIR) 10 MG tablet  Take 10 mg by mouth at bedtime. Patient not taking: Reported on 11/14/2021 04/03/18   [provider]    Inpatient Medications: Scheduled Meds:  aspirin EC  81 mg Oral Daily   heparin  5,000 Units Subcutaneous Q8H   lisinopril  20 mg Oral Daily   And   hydrochlorothiazide  12.5 mg Oral Daily   pantoprazole (PROTONIX) IV  40 mg Intravenous Q12H   Continuous Infusions:  PRN  Meds: acetaminophen **OR** acetaminophen (TYLENOL) oral liquid 160 mg/5 mL **OR** acetaminophen  Allergies:   No Known Allergies  Social History:   Social History   Socioeconomic History   Marital status: Married    Spouse name: Not on file   Number of children: Not on file   Years of education: Not on file   Highest education level: Not on file  Occupational History   Not on file  Tobacco Use   Smoking status: Former    Types: Pipe, Cigars    Quit date: 1965    Years since quitting: 58.4   Smokeless tobacco: Never  Substance and Sexual Activity   Alcohol use: No   Drug use: No   Sexual activity: Not on file  Other Topics Concern   Not on file  Social History Narrative   Not on file   Social Determinants of Health   Financial Resource Strain: Not on file  Food Insecurity: Not on file  Transportation Needs: Not on file  Physical Activity: Not on file  Stress: Not on file  Social Connections: Not on file  Intimate Partner Violence: Not on file    Family History:   History reviewed. No pertinent family history.   ROS:  Please see the history of present illness.   All other ROS reviewed and negative.     Physical Exam/Data:   Vitals:   11/14/21 2122 11/15/21 0029 11/15/21 0515 11/15/21 0731  BP: (!) 128/56 (!) 100/54 132/67 (!) 121/53  Pulse: 62 (!) 52 (!) 59 (!) 52  Resp: Temp: 98 F (36.7 C) 98.2 F (36.8 C) 98.1 F (36.7 C) 98.2 F (36.8 C)  TempSrc:      SpO2: 96% 96% 96% 96%  Weight:      Height:        Intake/Output Summary (Last 24 hours) at 11/15/2021 0852 Last data filed at 11/14/2021 1847 Gross per 24 hour  Intake 1087.52 ml  Output --  Net 1087.52 ml      11/14/2021   11:14 AM 05/03/2021    7:46 PM 04/05/2018    5:24 PM  Last 3 Weights  Weight (lbs) 179 lb 185 lb 184 lb 4.9 oz  Weight (kg) 81.194 kg 83.915 kg 83.6 kg     Body mass index is 24.97 kg/m.  General:  Well nourished, well developed, in no acute  distress HEENT: normal Neck: no JVD Vascular: No carotid bruits; Distal pulses 2+ bilaterally Cardiac:  normal S1, S2; RR, bradycardic; + murmur  Lungs:  clear to auscultation bilaterally, no wheezing, rhonchi or rales  Abd: soft, nontender, no hepatomegaly  Ext: no edema Musculoskeletal:  No deformities, BUE and BLE strength normal and equal Skin: warm and dry  Neuro:  CNs 2-12 intact, no focal abnormalities noted Psych:  Normal affect   EKG:  The EKG was personally reviewed and demonstrates:  Sinus bradycardia 47bpm 1st degree AV block PRI 432 ms Telemetry:  Telemetry was personally reviewed and demonstrates:  sinus brady HR upper  40-50s, 1st degree AV block, PRI 440-4560ms with sinus pauses, longest 2.2376ms  Relevant CV Studies:  Echo pending  Laboratory Data:  High Sensitivity Troponin:   Recent Labs  Lab 11/14/21 1119  TROPONINIHS 36*     Chemistry Recent Labs  Lab 11/14/21 1119  NA 135  K 4.3  CL 99  CO2 27  GLUCOSE 135*  BUN 27*  CREATININE 1.21  CALCIUM 9.8  GFRNONAA 57*  ANIONGAP 9    Recent Labs  Lab 11/14/21 1119  PROT 8.1  ALBUMIN 4.4  AST 31  ALT 15  ALKPHOS 70  BILITOT 1.9*   Lipids  Recent Labs  Lab 11/15/21 0432  CHOL 157  TRIG 160*  HDL 28*  LDLCALC 97  CHOLHDL 5.6    Hematology Recent Labs  Lab 11/14/21 1119  WBC 10.0  RBC 4.59  HGB 15.2  HCT 44.4  MCV 96.7  MCH 33.1  MCHC 34.2  RDW 12.4  PLT 193   Thyroid No results for input(s): "TSH", "FREET4" in the last 168 hours.  BNPNo results for input(s): "BNP", "PROBNP" in the last 168 hours.  DDimer No results for input(s): "DDIMER" in the last 168 hours.   Radiology/Studies:  CT ANGIO HEAD NECK W WO CM  Result Date: 11/14/2021 CLINICAL DATA:  Altered mental status EXAM: CT ANGIOGRAPHY HEAD AND NECK TECHNIQUE: Multidetector CT imaging of the head and neck was performed using the standard protocol during bolus administration of intravenous contrast. Multiplanar CT image  reconstructions and MIPs were obtained to evaluate the vascular anatomy. Carotid stenosis measurements (when applicable) are obtained utilizing NASCET criteria, using the distal internal carotid diameter as the denominator. RADIATION DOSE REDUCTION: This exam was performed according to the departmental dose-optimization program which includes automated exposure control, adjustment of the mA and/or kV according to patient size and/or use of iterative reconstruction technique. CONTRAST:  75mL OMNIPAQUE IOHEXOL 350 MG/ML SOLN COMPARISON:  None Available. FINDINGS: CTA NECK FINDINGS SKELETON: There is no bony spinal canal stenosis. No lytic or blastic lesion. OTHER NECK: Normal pharynx, larynx and major salivary glands. No cervical lymphadenopathy. Unremarkable thyroid gland. UPPER CHEST: No pneumothorax or pleural effusion. No nodules or masses. AORTIC ARCH: There is calcific atherosclerosis of the aortic arch. There is no aneurysm, dissection or hemodynamically significant stenosis of the visualized portion of the aorta. Conventional 3 vessel aortic branching pattern. The visualized proximal subclavian arteries are widely patent. RIGHT CAROTID SYSTEM: No dissection, occlusion or aneurysm. There is mixed density atherosclerosis extending into the proximal ICA, resulting in less than 50% stenosis. LEFT CAROTID SYSTEM: No dissection, occlusion or aneurysm. There is mixed density atherosclerosis extending into the proximal ICA, resulting in less than 50% stenosis. VERTEBRAL ARTERIES: Left dominant configuration. Both origins are clearly patent. There is no dissection, occlusion or flow-limiting stenosis to the skull base (V1-V3 segments). CTA HEAD FINDINGS POSTERIOR CIRCULATION: --Vertebral arteries: Normal V4 segments. --Inferior cerebellar arteries: Normal. --Basilar artery: Normal. --Superior cerebellar arteries: Normal. --Posterior cerebral arteries (PCA): Short segment occlusion of the right PCA P2 segment (series 6,  image 275). Left MCA is normal. ANTERIOR CIRCULATION: --Intracranial internal carotid arteries: Atherosclerotic calcification of the internal carotid arteries at the skull base without hemodynamically significant stenosis. --Anterior cerebral arteries (ACA): Normal. Both A1 segments are present. Patent anterior communicating artery (a-comm). --Middle cerebral arteries (MCA): Normal. VENOUS SINUSES: As permitted by contrast timing, patent. ANATOMIC VARIANTS: None Review of the MIP images confirms the above findings. IMPRESSION: 1. 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 2. Bilateral carotid bifurcation atherosclerosis without hemodynamically significant stenosis. 3.  Aortic atherosclerosis (ICD10-I70.0). Electronically Signed   By: Deatra Robinson M.D.   On: 11/14/2021 20:00   MR Brain Wo Contrast (neuro protocol)  Result Date: 11/14/2021 CLINICAL DATA:  Neuro deficit, acute, stroke suspected intermittent slurred speech EXAM: MRI HEAD WITHOUT CONTRAST TECHNIQUE: Multiplanar, multiecho pulse sequences of the brain and surrounding structures were obtained without intravenous contrast. COMPARISON:  None FINDINGS: Motion artifact is present. Brain: There is no acute infarction or intracranial hemorrhage. There is no intracranial mass, mass effect, or edema. There is no hydrocephalus or extra-axial fluid collection. Patchy and mildly confluent areas of T2 hyperintensity in the supratentorial and pontine white matter are nonspecific but may reflect mild to moderate chronic microvascular ischemic changes. There are chronic hemorrhagic infarcts of the left basal ganglia and subinsular white matter and left pons. Right corona radiata/posterior lentiform nucleus. Prominence of the ventricles and sulci reflects mild parenchymal volume loss. Vascular: Major vessel flow voids at the skull base are preserved. Skull and upper cervical spine: Normal marrow signal is preserved. Sinuses/Orbits: Paranasal sinuses are  aerated. Orbits are unremarkable. Other: Sella is unremarkable.  Mastoid air cells are clear. IMPRESSION: No acute infarction, hemorrhage, or mass. 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 Electronically Signed   By: Guadlupe Spanish M.D.   On: 11/14/2021 18:10   CT Head Wo Contrast  Result Date: 11/14/2021 CLINICAL DATA:  Altered mental status for the last 3 days EXAM: CT HEAD WITHOUT CONTRAST TECHNIQUE: Contiguous axial images were obtained from the base of the skull through the vertex without intravenous contrast. RADIATION DOSE REDUCTION: This exam was performed according to the departmental dose-optimization program which includes automated exposure control, adjustment of the mA and/or kV according to patient size and/or use of iterative reconstruction technique. COMPARISON:  05/04/2021 FINDINGS: Brain: The brainstem, cerebellum, cerebral peduncles, thalami, basal ganglia, basilar cisterns, and ventricular system appear within normal limits. Periventricular white matter and corona radiata hypodensities favor chronic ischemic microvascular white matter disease. No intracranial hemorrhage, mass lesion, or acute CVA. Vascular: There is atherosclerotic calcification of the cavernous carotid arteries bilaterally. Atherosclerotic calcification of the vertebral arteries noted. Skull: Unremarkable Sinuses/Orbits: Unremarkable Other: Calcification along the transverse ligament of the atlas. IMPRESSION: 1. No acute intracranial findings. 2. Periventricular white matter and corona radiata hypodensities favor chronic ischemic microvascular white matter disease. 3. Atherosclerosis. Electronically Signed   By: Gaylyn Rong M.D.   On: 11/14/2021 11:52   DG Chest 2 View  Result Date: 11/14/2021 CLINICAL DATA:  Weakness.  Altered mental status for 3 days. EXAM: CHEST - 2 VIEW COMPARISON:  05/04/2021 FINDINGS: Atherosclerotic calcification of the aortic arch. Thoracic spondylosis. Linear  subsegmental atelectasis or scarring along the left hemidiaphragm. No blunting of the costophrenic angles. Mild thoracic kyphosis appear stable. IMPRESSION: 1.  No acute cardiopulmonary disease is radiographically apparent. 2.  Aortic Atherosclerosis (ICD10-I70.0). 3. Minimal scarring or atelectasis along the left hemidiaphragm. 4. Thoracic spondylosis. Electronically Signed   By: Gaylyn Rong M.D.   On: 11/14/2021 11:44     Assessment and Plan:   Symptomatic Bradycardia/weakness - presented with weakness, AMS, slurred speech. CT head/MRI head negative for acute stroke. BP soft at times. Labs showed hypokalemia>this was repleted. UDS negative. Heart noted to be low, not on AV nodal blocking agents at baseline - EKG shows sinus bradycardia with heat rate into the 40s with first degree AV block PRI - tele shows SB with 1st degree  AV block with pauses, longest 2.41ms - I do not appreciate high grade AV block on tele - prior EKGs show HR in the 70s, first degree AV block, PRI 386ms-400ms - check TSH and Mag - echo pending - May be able to be discharged and follow-up with EP. If we cannot find reversible causes of bradycardia, may need a pacemaker given symptomatic bradycardia.  Elevated troponin - no prior ischemic evaluation - no anginal symptoms reported - HS trop minimally elevated at 36 - likely has some degree of CAD, can consider Coronary CTA as OP  AMS/slurred speech - CT head and MRI head negative for acute process - BP mildly low, amlodipine was stopped and lisinopril was decreased - pt is A&O today  HTN - on admission PTA amlodipine stopped  - PTA HCTZ-lisinopril continued - Bps intermittently soft - check orthostatics  Murmur - echo pending  For questions or updates, please contact [facility] Please consult [email or web address] for contact info under    Signed, Lerae Langham David Stall, PA-C  11/15/2021 8:52 AM

## 2021-11-15 NOTE — Discharge Summary (Signed)
Physician Discharge Summary   Patient: STANDLEY LOUISON MRN: [id number]  DOB: Dec 08, 1929   Admit:     Date of Admission: 11/14/2021 Admitted from: home   Discharge: Date of discharge: 11/15/21 Disposition: Home Condition at discharge: good  CODE STATUS: FULL   Diet recommendation: Cardiac diet   Discharge Physician: Emeterio Reeve, DO [facility]     PCP: Derinda Late, MD  Recommendations for Outpatient Follow-up:  Follow up with PCP Derinda Late, MD in 1-2 weeks Please obtain labs/tests: CBC, BMP in 1-2 weeks, monitor BP on new Rx, check lipids, CMP, CK in 4-6 weeks since starting atrovastatin Please follow up on the following pending results: noen Pt to follow w/ cardiology re: bradycardia, may require pacemaker placement especially if symptomatic   Printed for patient on AVS:   "MEDICATION CHANGES: Changing lisinopril-HCTZ dose to keep BP a bit higher. As long as pressure is average around 140/90 or so that should be fine. New Rx at pharmacy.  Adding atorvastatin to help prevent stroke. New Rx at pharmacy. Your PCP should follow up on your liver function tests in 1 few weeks / month to make sure you're tolerating the medication.  Stay on Aspirin, Omeprazole (for acid reflux) and Cetirizine (for allergies)"    Brief/Interim Summary: JOMAR WHITSETT is a 88 y.o. male with medical history significant of GERD, Barrett's esophagus, history of tobacco abuse coming to ED 11/14/2021 with complaints of altered mental status and inability to speak and slurred speech, dizzy and lightheaded. Patient having intermittent episodes that last about an hour or so and resolve has been getting worse in the 2 to 3 days PTA.  Admitting physician reported pt was able to speak at time of her exam, which he was unable to do earlier. Similar episode in November.  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 Restarted statin. Lowered BP medications, concern for low BP / orthostatic hypotension and old CVA causing reduced cerebral perfusion and TIA symptoms --> restart statin, lower BP Rx dose, continue ASA Noted bradycardia, cardiology eval and no concerns on Echo, TSH, Mg or telemetry --> follow outpatient   Consultants:  Cardiology  Procedures:  Echocardiogram       Discharge Diagnoses: Principal Problem:   Aphasia Active Problems:   Dizziness   Barrett's esophagus determined by biopsy   Benign essential hypertension   Abnormal central nervous system diagnostic imaging    Assessment & Plan:  Aphasia MRI shows chronic microvascular changes for patient.   TIA presentation. Suspect intermittent orthostasis predisposing to these kind of symptoms and hypoperfusion. HCTZ 12.5, decrease lisinopril to 10 mg.  Discontinue amlodipine. Currently patient's symptoms are resolved.  We will continue patient on atorvastatin and aspirin.  Dizziness Orthostatic dizziness reported No recurrence in hospital Cardiology evaluation completed, no concerns on Echo, known AS Follow w/ cardiology outpatient, may need EP evaluation for pacemaker   Barrett's esophagus determined by biopsy Resume home PPI  Benign essential hypertension Blood pressure medications tapered d/t concern hypoperfusion from orthostatic hypotension caused recrudescence old CVA and subsequent neuro symptoms  Continue HCTZ 12.5 p.o, Decrease lisinopril to 10 mg p.o. daily. Discontinue amlodipine.  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 2. Bilateral carotid bifurcation atherosclerosis without hemodynamically significant stenosis. 3.  Aortic atherosclerosis D/w neurology, no consult to them or neurosurgery required, ok to manage  medically as above       Discharge Instructions  Discharge Instructions     Diet - low sodium heart healthy   Complete by: As directed    Discharge instructions   Complete by: As directed    MEDICATION CHANGES: Changing lisinopril-HCTZ dose to keep BP a bit higher. As long as pressure is average around 140/90 or so that should be fine. New Rx at pharmacy.  Adding atorvastatin to help prevent stroke. New Rx at pharmacy. Your PCP should follow up on your liver function tests in 1 few weeks / month to make sure you're tolerating the medication.  Stay on Aspirin, Omeprazole (for acid reflux) and Cetirizine (for allergies)   Increase activity slowly   Complete by: As directed        Allergies as of 11/15/2021   No Known Allergies      Medication List     STOP taking these medications    fluticasone 50 MCG/ACT nasal spray Commonly known as: FLONASE   lisinopril-hydrochlorothiazide 20-12.5 MG tablet Commonly known as: ZESTORETIC Replaced by: lisinopril-hydrochlorothiazide 10-12.5 MG tablet   montelukast 10 MG tablet Commonly known as: SINGULAIR       TAKE these medications    aspirin EC 81 MG tablet Take 81 mg by mouth daily.   atorvastatin 40 MG tablet Commonly known as: Lipitor Take 1 tablet (40 mg total) by mouth daily.   cetirizine 10 MG tablet Commonly known as: ZYRTEC Take 10 mg by mouth daily.   lisinopril-hydrochlorothiazide 10-12.5 MG tablet Commonly known as: Zestoretic Take 1 tablet by mouth daily. Replaces: lisinopril-hydrochlorothiazide 20-12.5 MG tablet   omeprazole 20 MG capsule Commonly known as: PRILOSEC Take 20 mg by mouth daily.        Diet Orders (From admission, onward)     Start     Ordered   11/15/21 0000  Diet - low sodium heart healthy        11/15/21 1252   11/14/21 1824  Diet regular Room service appropriate? Yes; Fluid consistency: Thin  Diet effective now       Question Answer Comment  Room service appropriate? Yes    Fluid consistency: Thin      11/14/21 1823               No Known Allergies   Subjective: pt doing well, dressed and ready to go hmoe! No persistent or new aphasia, weakness, other deficits. Per himself and wife he is at baseline. Denied CP/SOB, HA/VC   Discharge Exam: Vitals:   11/15/21 0731 11/15/21 1124  BP: (!) 121/53 127/62  Pulse: (!) 52 62  Resp: 18 18  Temp: 98.2 F (36.8 C) 98.6 F (37 C)  SpO2: 96% 99%   Vitals:   11/15/21 0029 11/15/21 0515 11/15/21 0731 11/15/21 1124  BP: (!) 100/54 132/67 (!) 121/53 127/62  Pulse: (!) 52 (!) 59 (!) 52 62  Resp: 20 19 18 18   Temp: 98.2 F (36.8 C) 98.1 F (36.7 C) 98.2 F (36.8 C) 98.6 F (37 C)  TempSrc:      SpO2: 96% 96% 96% 99%  Weight:      Height:         General: Pt is alert, awake, not in acute distress Cardiovascular: S1/S2 +murmur, reg rhythm rate is 50s, no rubs, no gallops Respiratory: CTA bilaterally, no wheezing, no rhonchi Abdominal: Soft, NT, ND, bowel sounds +  Extremities: no edema, no cyanosis     The results of significant diagnostics from this hospitalization (including imaging, microbiology, ancillary and laboratory) are listed below for reference.     Microbiology: Recent Results (from the past 240 hour(s))  SARS Coronavirus 2 by RT PCR (hospital order, performed in [facility] hospital lab) *cepheid single result test* Anterior Nasal Swab     Status: None   Collection Time: 11/14/21  3:58 PM   Specimen: Anterior Nasal Swab  Result Value Ref Range Status   SARS Coronavirus 2 by RT PCR NEGATIVE NEGATIVE Final    Comment: (NOTE) SARS-CoV-2 target nucleic acids are NOT DETECTED.  The SARS-CoV-2 RNA is generally detectable in upper and lower respiratory specimens during the acute phase of infection. The lowest concentration of SARS-CoV-2 viral copies this assay can detect is 250 copies / mL. A negative result does not preclude SARS-CoV-2 infection and should not be used as the  sole basis for treatment or other patient management decisions.  A negative result may occur with improper specimen collection / handling, submission of specimen other than nasopharyngeal swab, presence of viral mutation(s) within the areas targeted by this assay, and inadequate number of viral copies (<250 copies / mL). A negative result must be combined with clinical observations, patient history, and epidemiological information.  Fact Sheet for Patients:   https://www.patel.info/  Fact Sheet for Healthcare Providers: https://hall.com/  This test is not yet approved or  cleared by the [place] FDA and has been authorized for detection and/or diagnosis of SARS-CoV-2 by FDA under an Emergency Use Authorization (EUA).  This EUA will remain in effect (meaning this test can be used) for the duration of the COVID-19 declaration under Section 564(b)(1) of the Act, 21 U.S.C. section 360bbb-3(b)(1), unless the authorization is terminated or revoked sooner.  Performed at [facility], [address] [place], [place] [address]      Labs: BNP (last 3 results) No results for input(s): "BNP" in the last 8760 hours. Basic Metabolic Panel: Recent Labs  Lab 11/14/21 1119 11/15/21 0432  NA 135  --   K 4.3  --   CL 99  --   CO2 27  --   GLUCOSE 135*  --   BUN 27*  --   CREATININE 1.21  --   CALCIUM 9.8  --   MG  --  2.2   Liver Function Tests: Recent Labs  Lab 11/14/21 1119  AST 31  ALT 15  ALKPHOS 70  BILITOT 1.9*  PROT 8.1  ALBUMIN 4.4   No results for input(s): "LIPASE", "AMYLASE" in the last 168 hours. No results for input(s): "AMMONIA" in the last 168 hours. CBC: Recent Labs  Lab 11/14/21 1119  WBC 10.0  NEUTROABS 7.6  HGB 15.2  HCT 44.4  MCV 96.7  PLT 193   Cardiac Enzymes: No results for input(s): "CKTOTAL", "CKMB", "CKMBINDEX", "TROPONINI" in the last 168 hours. BNP: Invalid input(s):  "POCBNP" CBG: No results for input(s): "GLUCAP" in the last 168 hours. D-Dimer No results for input(s): "DDIMER" in the last 72 hours. Hgb A1c Recent Labs    11/14/21 1119  HGBA1C 5.5   Lipid Profile Recent Labs    11/15/21 0432  CHOL 157  HDL 28*  LDLCALC 97  TRIG 160*  CHOLHDL 5.6   Thyroid function studies Recent Labs    11/15/21 0432  TSH 1.861   Anemia work up No results for input(s): "VITAMINB12", "FOLATE", "FERRITIN", "TIBC", "IRON", "RETICCTPCT" in the  last 72 hours. Urinalysis    Component Value Date/Time   COLORURINE AMBER (A) 11/14/2021 1558   APPEARANCEUR CLOUDY (A) 11/14/2021 1558   LABSPEC 1.019 11/14/2021 1558   PHURINE 5.0 11/14/2021 1558   GLUCOSEU NEGATIVE 11/14/2021 1558   HGBUR NEGATIVE 11/14/2021 1558   BILIRUBINUR NEGATIVE 11/14/2021 1558   KETONESUR 5 (A) 11/14/2021 1558   PROTEINUR 100 (A) 11/14/2021 1558   NITRITE NEGATIVE 11/14/2021 1558   LEUKOCYTESUR NEGATIVE 11/14/2021 1558   Sepsis Labs Recent Labs  Lab 11/14/21 1119  WBC 10.0   Microbiology Recent Results (from the past 240 hour(s))  SARS Coronavirus 2 by RT PCR (hospital order, performed in [facility] hospital lab) *cepheid single result test* Anterior Nasal Swab     Status: None   Collection Time: 11/14/21  3:58 PM   Specimen: Anterior Nasal Swab  Result Value Ref Range Status   SARS Coronavirus 2 by RT PCR NEGATIVE NEGATIVE Final    Comment: (NOTE) SARS-CoV-2 target nucleic acids are NOT DETECTED.  The SARS-CoV-2 RNA is generally detectable in upper and lower respiratory specimens during the acute phase of infection. The lowest concentration of SARS-CoV-2 viral copies this assay can detect is 250 copies / mL. A negative result does not preclude SARS-CoV-2 infection and should not be used as the sole basis for treatment or other patient management decisions.  A negative result may occur with improper specimen collection / handling, submission of specimen other than  nasopharyngeal swab, presence of viral mutation(s) within the areas targeted by this assay, and inadequate number of viral copies (<250 copies / mL). A negative result must be combined with clinical observations, patient history, and epidemiological information.  Fact Sheet for Patients:   RoadLapTop.co.za  Fact Sheet for Healthcare Providers: http://kim-miller.com/  This test is not yet approved or  cleared by the [place] FDA and has been authorized for detection and/or diagnosis of SARS-CoV-2 by FDA under an Emergency Use Authorization (EUA).  This EUA will remain in effect (meaning this test can be used) for the duration of the COVID-19 declaration under Section 564(b)(1) of the Act, 21 U.S.C. section 360bbb-3(b)(1), unless the authorization is terminated or revoked sooner.  Performed at [facility], [address] [place], [place] [address]    Imaging ECHOCARDIOGRAM COMPLETE BUBBLE STUDY  Result Date: 11/15/2021    ECHOCARDIOGRAM REPORT   Patient Name:   KEYVAN DEVEAUX Date of Exam: 11/15/2021 Medical Rec #:  [id number]         Height:       71.0 in Accession #:    [id number]        Weight:       179.0 lb Date of Birth:  July 17, 1929        BSA:          2.011 m Patient Age:    88 years          BP:           121/53 mmHg Patient Gender: M                 HR:           67 bpm. Exam Location:  [facility] Procedure: 2D Echo, Color Doppler, Cardiac Doppler and Saline Contrast Bubble            Study Indications:     I63.9 Stroke  History:         Patient has prior history of Echocardiogram examinations. Risk  Factors:Hypertension and HCL.  Sonographer:     Charmayne Sheer Referring Phys:  [place] Diagnosing Phys: Ida Rogue MD  Sonographer Comments: Image acquisition challenging due to respiratory motion. IMPRESSIONS  1. Left ventricular ejection fraction, by estimation, is 55 to 60%. The left ventricle has  normal function. The left ventricle has no regional wall motion abnormalities. Left ventricular diastolic parameters are consistent with Grade I diastolic dysfunction (impaired relaxation).  2. Right ventricular systolic function is normal. The right ventricular size is normal.  3. Left atrial size was mildly dilated.  4. The mitral valve is normal in structure. No evidence of mitral valve regurgitation. No evidence of mitral stenosis.  5. The aortic valve is normal in structure. There is severe calcifcation of the aortic valve. Aortic valve regurgitation is not visualized. Moderate aortic valve stenosis. Aortic valve area, by VTI measures 0.96 cm. Aortic valve mean gradient measures 21.0 mmHg. Aortic valve Vmax measures 2.88 m/s.  6. The inferior vena cava is normal in size with greater than 50% respiratory variability, suggesting right atrial pressure of 3 mmHg.  7. Agitated saline contrast bubble study was negative, with no evidence of any interatrial shunt. FINDINGS  Left Ventricle: Left ventricular ejection fraction, by estimation, is 55 to 60%. The left ventricle has normal function. The left ventricle has no regional wall motion abnormalities. The left ventricular internal cavity size was normal in size. There is  no left ventricular hypertrophy. Left ventricular diastolic parameters are consistent with Grade I diastolic dysfunction (impaired relaxation). Right Ventricle: The right ventricular size is normal. No increase in right ventricular wall thickness. Right ventricular systolic function is normal. Left Atrium: Left atrial size was mildly dilated. Right Atrium: Right atrial size was normal in size. Pericardium: There is no evidence of pericardial effusion. Mitral Valve: The mitral valve is normal in structure. No evidence of mitral valve regurgitation. No evidence of mitral valve stenosis. MV peak gradient, 2.9 mmHg. The mean mitral valve gradient is 2.0 mmHg. Tricuspid Valve: The tricuspid valve is  normal in structure. Tricuspid valve regurgitation is mild . No evidence of tricuspid stenosis. Aortic Valve: The aortic valve is normal in structure. There is severe calcifcation of the aortic valve. Aortic valve regurgitation is not visualized. Moderate aortic stenosis is present. Aortic valve mean gradient measures 21.0 mmHg. Aortic valve peak gradient measures 33.2 mmHg. Aortic valve area, by VTI measures 0.96 cm. Pulmonic Valve: The pulmonic valve was normal in structure. Pulmonic valve regurgitation is not visualized. No evidence of pulmonic stenosis. Aorta: The aortic root is normal in size and structure. Venous: The inferior vena cava is normal in size with greater than 50% respiratory variability, suggesting right atrial pressure of 3 mmHg. IAS/Shunts: No atrial level shunt detected by color flow Doppler. Agitated saline contrast was given intravenously to evaluate for intracardiac shunting. Agitated saline contrast bubble study was negative, with no evidence of any interatrial shunt.  LEFT VENTRICLE PLAX 2D LVIDd:         4.47 cm   Diastology LVIDs:         2.43 cm   LV e' medial:    7.72 cm/s LV PW:         1.04 cm   LV E/e' medial:  7.5 LV IVS:        1.12 cm   LV e' lateral:   11.00 cm/s LVOT diam:     2.00 cm   LV E/e' lateral: 5.3 LV SV:  48 LV SV Index:   24 LVOT Area:     3.14 cm  RIGHT VENTRICLE RV Basal diam:  4.27 cm RV S prime:     15.80 cm/s LEFT ATRIUM           Index        RIGHT ATRIUM           Index LA diam:      4.70 cm 2.34 cm/m   RA Area:     19.30 cm LA Vol (A4C): 62.5 ml 31.07 ml/m  RA Volume:   59.40 ml  29.53 ml/m  AORTIC VALVE                     PULMONIC VALVE AV Area (Vmax):    0.99 cm      PV Vmax:          1.55 m/s AV Area (Vmean):   1.12 cm      PV Vmean:         103.000 cm/s AV Area (VTI):     0.96 cm      PV VTI:           0.319 m AV Vmax:           288.00 cm/s   PV Peak grad:     9.6 mmHg AV Vmean:          167.667 cm/s  PV Mean grad:     5.0 mmHg AV VTI:             0.504 m       PR End Diast Vel: 3.51 msec AV Peak Grad:      33.2 mmHg AV Mean Grad:      21.0 mmHg LVOT Vmax:         90.70 cm/s LVOT Vmean:        60.000 cm/s LVOT VTI:          0.154 m LVOT/AV VTI ratio: 0.31  AORTA Ao Root diam: 3.80 cm MITRAL VALVE MV Area (PHT): 3.99 cm    SHUNTS MV Area VTI:   2.23 cm    Systemic VTI:  0.15 m MV Peak grad:  2.9 mmHg    Systemic Diam: 2.00 cm MV Mean grad:  2.0 mmHg MV Vmax:       0.85 m/s MV Vmean:      59.5 cm/s MV Decel Time: 190 msec MV E velocity: 57.80 cm/s MV A velocity: 71.10 cm/s MV E/A ratio:  0.81 Ida Rogue MD Electronically signed by Ida Rogue MD Signature Date/Time: 11/15/2021/12:34:44 PM    Final       Time coordinating discharge: Over 30 minutes  SIGNED:  Emeterio Reeve DO [facility]

## 2021-11-15 NOTE — Progress Notes (Signed)
SLP Cancellation Note  Patient Details Name: Carlos Frey MRN: [id number] DOB: 05-10-1930   Cancelled treatment:       Reason Eval/Treat Not Completed: Patient at procedure or test/unavailable   SLP consult received and appreciated. Chart review completed. SLP evaluation unable to be completed at this time as pt OTF for echocardiogram with bubble study. Will continue efforts as appropriate.  Clyde Canterbury, M.S., CCC-SLP Speech-Language Pathologist [facility] [phone] (ASCOM)   Woodroe Chen 11/15/2021, 8:22 AM

## 2021-11-15 NOTE — Evaluation (Signed)
Physical Therapy Evaluation Patient Details Name: Carlos Frey MRN: [id number] DOB: 05/16/1930 Today's Date: 11/15/2021  History of Present Illness  88 y.o. male arrived to ER with complaints of intermittant AMS and aphasia over the past 2-3 days. Medical history significant of GERD, Barrett's esophagus, history of tobacco abuse.   Clinical Impression  Pt received supine in bed, agreeable to therapy; wife in room. Pt was fully independent prior to admission and states he is a very active person. Bed mobility and STS performed independently. Strength and sensation equal bilaterally. Mild impairment in RUE coordination, possibly related to essential tremor. He was slightly unstable with initial steps during ambulation; stability improved with distance of gait. PT encouraged frequent mobility while in hospital setting to improve stability and confidence with walking - did recommend RN to be present during ambulation until pt feels 100% back to baseline. He does not present with PT needs. Will sign-off at this time; please re-consult if status changes.      Recommendations for follow up therapy are one component of a multi-disciplinary discharge planning process, led by the attending physician.  Recommendations may be updated based on patient status, additional functional criteria and insurance authorization.  Follow Up Recommendations No PT follow up    Assistance Recommended at Discharge PRN  Patient can return home with the following       Equipment Recommendations    Recommendations for Other Services       Functional Status Assessment Patient has not had a recent decline in their functional status     Precautions / Restrictions Precautions Precautions: None Restrictions Weight Bearing Restrictions: No      Mobility  Bed Mobility Overal bed mobility: Independent                  Transfers Overall transfer level: Independent                 General  transfer comment: STS from EOB, no assist required, no safety concerns    Ambulation/Gait Ambulation/Gait assistance: Supervision Gait Distance (Feet): 200 Feet Assistive device: None Gait Pattern/deviations: Step-through pattern, WFL(Within Functional Limits) Gait velocity: WFL     General Gait Details: Mild lack of stability with initial steps; both stability and gait velocity improved with distance.  Stairs            Wheelchair Mobility    Modified Rankin (Stroke Patients Only)       Balance Overall balance assessment: Needs assistance   Sitting balance-Leahy Scale: Normal       Standing balance-Leahy Scale: Good Standing balance comment: Moderate trunk sway with eyes closed utilizing ankle strateigies to correct, no difficulty with turning in place or navigating small spaces. Mild instability with initial steps, does not require AD or external steadying; improved with increased ambulation distance.                             Pertinent Vitals/Pain Pain Assessment Pain Assessment: No/denies pain    Home Living Family/patient expects to be discharged to:: Private residence Living Arrangements: Spouse/significant other Available Help at Discharge: Family;Available 24 hours/day Type of Home: House           Home Equipment: Agricultural consultant (2 wheels);Cane - single point Additional Comments: Owns DME but does not use either at baseline.    Prior Function Prior Level of Function : Independent/Modified Independent  Mobility Comments: Pt is very active and fully independent at baseline. Denies history of falls.       Hand Dominance        Extremity/Trunk Assessment   Upper Extremity Assessment Upper Extremity Assessment: Overall WFL for tasks assessed (Sensation equal bilaterally; mild impairment in RUE coordination (thumb to finger))    Lower Extremity Assessment Lower Extremity Assessment: Overall WFL for tasks assessed  (heel to shin coordination normal. sensation equal bilaterally)       Communication   Communication: No difficulties  Cognition Arousal/Alertness: Awake/alert Behavior During Therapy: WFL for tasks assessed/performed Overall Cognitive Status: Within Functional Limits for tasks assessed                                 General Comments: A&Ox4        General Comments      Exercises     Assessment/Plan    PT Assessment Patient does not need any further PT services  PT Problem List Decreased balance       PT Treatment Interventions      PT Goals (Current goals can be found in the Care Plan section)  Acute Rehab PT Goals Patient Stated Goal: to not have another episode PT Goal Formulation: With patient    Frequency       Co-evaluation               AM-PAC PT "6 Clicks" Mobility  Outcome Measure Help needed turning from your back to your side while in a flat bed without using bedrails?: None Help needed moving from lying on your back to sitting on the side of a flat bed without using bedrails?: None Help needed moving to and from a bed to a chair (including a wheelchair)?: None Help needed standing up from a chair using your arms (e.g., wheelchair or bedside chair)?: None Help needed to walk in hospital room?: A Little Help needed climbing 3-5 steps with a railing? : A Little 6 Click Score: 22    End of Session   Activity Tolerance: Patient tolerated treatment well Patient left: in bed;with family/visitor present   PT Visit Diagnosis: Other symptoms and signs involving the nervous system (K74.259)    Time: 5638-7564 PT Time Calculation (min) (ACUTE ONLY): 13 min   Charges:   PT Evaluation $PT Eval Low Complexity: 1 Low          Basilia Jumbo PT, DPT

## 2021-11-15 NOTE — Progress Notes (Signed)
Pt A/Ox4 upon reivew of AVS. PIV removed. Tele removed.escorted via wheelchair to entrance. Able to dress himself.

## 2021-11-15 NOTE — Progress Notes (Signed)
*  PRELIMINARY RESULTS* Echocardiogram 2D Echocardiogram has been performed.  Joanette Gula Mayce Noyes 11/15/2021, 11:01 AM

## 2021-11-15 NOTE — Assessment & Plan Note (Addendum)
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 2. Bilateral carotid bifurcation atherosclerosis without hemodynamically significant stenosis. 3.  Aortic atherosclerosis D/w neurology, no consult to them or neurosurgery required, ok to manage medically as above

## 2021-11-15 NOTE — Telephone Encounter (Signed)
I called and spoke with the patient. I advised that we had received a message in the clinic that we needed to order a 2 week heart monitor for him to wear. The patient voices understanding & was agreeable- he recalled this being discussed with him prior to discharge.   I have confirmed the mailing address on file is correct for him. I have also confirmed that his wife will be his emergency contact on file.  The only 2 #'s they have are: [phone]- Home [phone] Cell  The patient is advised his monitor will be ordered today as a live time monitor. He should receive this within 3-5 business days, but if more than 5 business days and he has not received this, I have asked him to call us back at the office at [phone].  The patient voices understanding of all of the above and is agreeable. He was very appreciative of the call.

## 2021-11-15 NOTE — Evaluation (Signed)
Speech Language Pathology Evaluation Patient Details Name: Carlos Frey MRN: [id number] DOB: 13-Apr-1930 Today's Date: 11/15/2021 Time: 4315-4008 SLP Time Calculation (min) (ACUTE ONLY): 25 min  Problem List:  Patient Active Problem List   Diagnosis Date Noted   Abnormal central nervous system diagnostic imaging 11/15/2021   Aphasia 11/14/2021   Dizziness 11/14/2021   Acute confusion 05/04/2021   Rhinovirus infection    1st degree AV block    Near syncope 04/05/2018   Benign essential hypertension 04/05/2014   Barrett's esophagus determined by biopsy 11/23/2013   Past Medical History:  Past Medical History:  Diagnosis Date   Barrett esophagus    GERD (gastroesophageal reflux disease)    High cholesterol    Hypertension    Past Surgical History:  Past Surgical History:  Procedure Laterality Date   APPENDECTOMY     COLONOSCOPY  08/08/03, 12/05/08 & 12/14/13   ESOPHAGOGASTRODUODENOSCOPY  10/09/09, 12/25/09 & 03/24/12   ESOPHAGOGASTRODUODENOSCOPY (EGD) WITH PROPOFOL N/A 07/17/2017   Procedure: ESOPHAGOGASTRODUODENOSCOPY (EGD) WITH PROPOFOL;  Surgeon: Christena Deem, MD;  Location: [facility] ENDOSCOPY;  Service: Endoscopy;  Laterality: N/A;   TONSILLECTOMY     HPI:  Per Physician's H&P "Carlos Frey is a 88 y.o. male with medical history significant of GERD, Barrett's esophagus, history of tobacco abuse coming with complaints of altered mental status and inability to speak and slurred speech.  Patient has been having intermittent episodes that last about an hour or so and resolve has been getting worse in the past 2 to 3 days.  On my exam patient states that he is now able to speak which he was unable to do earlier today.  Patient reports that he had a similar episode in November.  Patient states he had this reported dizziness and lightheadedness symptoms that were there in November and currently.  Patient today does not blurred vision shortness of breath chest pain palpitations  nausea vomiting diarrhea fevers chills abdominal complaints or any bladder issues." MRI Brain 11/14/21 "No acute infarction, hemorrhage, or mass.     Chronic infarcts and chronic microvascular ischemic changes." CT Angio Head Neck 11/14/21 "1. 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  2. Bilateral carotid bifurcation atherosclerosis without  hemodynamically significant stenosis.  3.  Aortic atherosclerosis (ICD10-I70.0). "   Assessment / Plan / Recommendation Clinical Impression  Pt seen for speech/language evaluation. Pt awake, alert, and cooperative. Endorses resolution of speech/language deficits. Wife, at bedside, agrees.   Pt evaluated via informal means and portions of Western Aphasia Battery Revised (Bedside Record Form). Pt A&Ox4. Pt demonstrated intact basic primary language skills (auditory comprehension and verbal expression). Pt with occasional need for extra time while answering complex yes/no questions and following complex conversation. x1 instance of anomia during conversational speech; pt independently repaired with extra time. No s/sx apraxia of speech and dysarthria appreciated during evaluation.   Pt is likely at baseline for speech/language ability given clinical presentation and pt/wife report. SLP to sign off as pt has no acute SLP needs.   Pt, pt's wife, and RN made aware of results, recommendations, and SLP POC. Pt and wife verbalized understanding/agreement.     SLP Assessment  SLP Recommendation/Assessment: Patient does not need any further Speech Lanaguage Pathology Services SLP Visit Diagnosis: Aphasia (R47.01)    Recommendations for follow up therapy are one component of a multi-disciplinary discharge planning process, led by the attending physician.  Recommendations may be updated based on patient status, additional functional criteria and insurance authorization.  Follow Up Recommendations  No SLP follow up    Assistance Recommended at Discharge  PRN   Functional Status Assessment Patient has not had a recent decline in their functional status     SLP Evaluation Cognition  Overall Cognitive Status: Within Functional Limits for tasks assessed Orientation Level: Oriented X4       Comprehension  Auditory Comprehension Overall Auditory Comprehension: Appears within functional limits for tasks assessed Yes/No Questions: Within Functional Limits (with extra time) Commands: Within Functional Limits Conversation: Complex (with occasional need for extra time) EffectiveTechniques: Extra processing time Visual Recognition/Discrimination Discrimination: Not tested Reading Comprehension Reading Status: Not tested    Expression Expression Primary Mode of Expression: Verbal Verbal Expression Overall Verbal Expression: Appears within functional limits for tasks assessed Initiation: No impairment Automatic Speech:  (WFL) Repetition: No impairment Naming: No impairment (confrontation namnig) Pragmatics: No impairment Other Verbal Expression Comments: x1 instance of anomia during conversational speech; pt independently repaired with extra time Written Expression Written Expression: Not tested   Oral / Motor  Oral Motor/Sensory Function Overall Oral Motor/Sensory Function: Within functional limits Motor Speech Overall Motor Speech: Appears within functional limits for tasks assessed Respiration: Within functional limits Resonance: Within functional limits Articulation: Within functional limitis Intelligibility: Intelligible Motor Planning: Witnin functional limits           Clyde Canterbury, M.S., CCC-SLP Speech-Language Pathologist [facility] [phone] (ASCOM)  Woodroe Chen 11/15/2021, 1:11 PM

## 2021-11-15 NOTE — Progress Notes (Signed)
Nutrition Brief Note  RD consulted for assessment of nutritional needs/ status.   Wt Readings from Last 15 Encounters:  11/14/21 81.2 kg  05/03/21 83.9 kg  04/05/18 83.6 kg  07/17/17 87.1 kg   Pt with medical history significant of GERD, Barrett's esophagus, history of tobacco abuse coming with complaints of altered mental status and inability to speak and slurred speech.  Pt admitted with aphasia.   Reviewed I/O's: +1.1 L x 24 hours  Spoke with pt and wife at bedside. Pt states he feels so much better since admission. He reports decreased oral intake for 1 day PTA secondary to feeling weak and light headed. He shares he usually consumes 2-3 meals per day (prepared by wife, usually a meat, starch, and vegetables) and also snacks throughout the day. He denies any difficulty chewing and swallowing foods. Observed pt consuming a [employer] biscuit without difficulty.   Pt prides himself on being very active. PT to evaluate pt after RD visit.   Pt denies any weight loss.   Nutrition-Focused physical exam completed. Findings are no fat depletion, mild muscle depletion, and no edema.    Labs reviewed: CBGS: 118.   Current diet order is regular, patient is consuming approximately 80% of meals at this time. Labs and medications reviewed.   No nutrition interventions warranted at this time. If nutrition issues arise, please consult RD.   Levada Schilling, RD, LDN, CDCES Registered Dietitian II Certified Diabetes Care and Education Specialist Please refer to [facility] for RD and/or RD on-call/weekend/after hours pager

## 2021-11-15 NOTE — Evaluation (Signed)
Occupational Therapy Evaluation Patient Details Name: Carlos Frey MRN: [id number] DOB: Apr 24, 1930 Today's Date: 11/15/2021   History of Present Illness 88 y.o. male arrived to ER with complaints of intermittant AMS and aphasia over the past 2-3 days. Medical history significant of GERD, Barrett's esophagus, history of tobacco abuse.   Clinical Impression   Patient supine in bed and agreeable to OT intervention. Patient reports living at home with wife at independent level. Pt is very active at baseline. Pt with some minimal R hand coordination and speed deficits but very functional otherwise. Pt reports this is his baseline. Pt ambulates in room and performs grooming tasks in standing without difficult. Pt dons shoe and ties laces without difficulty. No need for skilled OT intervention at this time. OT to sogn off and pt agrees.      Recommendations for follow up therapy are one component of a multi-disciplinary discharge planning process, led by the attending physician.  Recommendations may be updated based on patient status, additional functional criteria and insurance authorization.   Follow Up Recommendations  No OT follow up    Assistance Recommended at Discharge None     Functional Status Assessment  Patient has not had a recent decline in their functional status  Equipment Recommendations  None recommended by OT       Precautions / Restrictions Precautions Precautions: None Restrictions Weight Bearing Restrictions: No      Mobility Bed Mobility Overal bed mobility: Independent                  Transfers Overall transfer level: Independent                        Balance Overall balance assessment: Needs assistance   Sitting balance-Leahy Scale: Normal       Standing balance-Leahy Scale: Good                             ADL either performed or assessed with clinical judgement   ADL Overall ADL's : Independent                                        General ADL Comments: Pt standing at sink and ambulating in room without use of AD. No LOB.     Vision Patient Visual Report: No change from baseline Vision Assessment?: No apparent visual deficits            Pertinent Vitals/Pain Pain Assessment Pain Assessment: No/denies pain     Hand Dominance Right   Extremity/Trunk Assessment Upper Extremity Assessment Upper Extremity Assessment: Overall WFL for tasks assessed;RUE deficits/detail RUE Deficits / Details: pt reports R UE as being dominant with some decreased speed and dexterity but completely functional and pt reports baseline   Lower Extremity Assessment Lower Extremity Assessment: Overall WFL for tasks assessed       Communication Communication Communication: No difficulties   Cognition Arousal/Alertness: Awake/alert Behavior During Therapy: WFL for tasks assessed/performed Overall Cognitive Status: Within Functional Limits for tasks assessed                                 General Comments: A&Ox4 and is pleasant and cooperative  Home Living Family/patient expects to be discharged to:: Private residence Living Arrangements: Spouse/significant other Available Help at Discharge: Family;Available 24 hours/day Type of Home: House       Home Layout: One level     Bathroom Shower/Tub: Walk-in shower         Home Equipment: Agricultural consultant (2 wheels);Cane - single point   Additional Comments: Owns DME but does not use either at baseline.  Lives With: Spouse    Prior Functioning/Environment Prior Level of Function : Independent/Modified Independent             Mobility Comments: Pt is very active and fully independent at baseline. Denies history of falls. ADLs Comments: Ind in all aspects of care                 OT Goals(Current goals can be found in the care plan section) Acute Rehab OT Goals Patient Stated Goal: to go  home OT Goal Formulation: With patient Time For Goal Achievement: 11/15/21 Potential to Achieve Goals: Good  OT Frequency:         AM-PAC OT "6 Clicks" Daily Activity     Outcome Measure Help from another person eating meals?: None Help from another person taking care of personal grooming?: None Help from another person toileting, which includes using toliet, bedpan, or urinal?: None Help from another person bathing (including washing, rinsing, drying)?: None Help from another person to put on and taking off regular upper body clothing?: None Help from another person to put on and taking off regular lower body clothing?: None 6 Click Score: 24   End of Session Nurse Communication: Mobility status  Activity Tolerance: Patient tolerated treatment well Patient left: in bed                   Time: 1101-1116 OT Time Calculation (min): 15 min Charges:  OT General Charges $OT Visit: 1 Visit OT Evaluation $OT Eval Low Complexity: 1 Low  Jackquline Denmark, MS, OTR/L , CBIS ascom [phone]  11/15/21, 1:19 PM

## 2021-11-16 LAB — URINE CULTURE: Culture: <10000 — AB

## 2021-12-08 ENCOUNTER — Other Ambulatory Visit: Payer: Self-pay

## 2021-12-08 ENCOUNTER — Emergency Department: Payer: Medicare Other

## 2021-12-08 ENCOUNTER — Observation Stay
Admission: EM | Admit: 2021-12-08 | Discharge: 2021-12-10 | Disposition: A | Discharge status: Home / Self Care | Payer: Medicare Other | Attending: Internal Medicine | Admitting: Internal Medicine

## 2021-12-08 DIAGNOSIS — R4701 Aphasia: Secondary | ICD-10-CM | POA: Diagnosis not present

## 2021-12-08 DIAGNOSIS — R319 Hematuria, unspecified: Secondary | ICD-10-CM | POA: Diagnosis not present

## 2021-12-08 DIAGNOSIS — Z87891 Personal history of nicotine dependence: Secondary | ICD-10-CM | POA: Diagnosis not present

## 2021-12-08 DIAGNOSIS — Z7982 Long term (current) use of aspirin: Secondary | ICD-10-CM | POA: Insufficient documentation

## 2021-12-08 DIAGNOSIS — K219 Gastro-esophageal reflux disease without esophagitis: Secondary | ICD-10-CM

## 2021-12-08 DIAGNOSIS — R479 Unspecified speech disturbances: Secondary | ICD-10-CM | POA: Diagnosis present

## 2021-12-08 DIAGNOSIS — I10 Essential (primary) hypertension: Secondary | ICD-10-CM

## 2021-12-08 DIAGNOSIS — G459 Transient cerebral ischemic attack, unspecified: Secondary | ICD-10-CM | POA: Diagnosis not present

## 2021-12-08 DIAGNOSIS — N4889 Other specified disorders of penis: Secondary | ICD-10-CM | POA: Diagnosis not present

## 2021-12-08 DIAGNOSIS — Z79899 Other long term (current) drug therapy: Secondary | ICD-10-CM | POA: Insufficient documentation

## 2021-12-08 DIAGNOSIS — E785 Hyperlipidemia, unspecified: Secondary | ICD-10-CM

## 2021-12-08 LAB — DIFFERENTIAL
Abs Immature Granulocytes: 0.02 10*3/uL (ref 0.00–0.07)
Basophils Absolute: 0 10*3/uL (ref 0.0–0.1)
Basophils Relative: 0 %
Eosinophils Absolute: 0.1 10*3/uL (ref 0.0–0.5)
Eosinophils Relative: 1 %
Immature Granulocytes: 0 %
Lymphocytes Relative: 17 %
Lymphs Abs: 1.1 10*3/uL (ref 0.7–4.0)
Monocytes Absolute: 0.4 10*3/uL (ref 0.1–1.0)
Monocytes Relative: 7 %
Neutro Abs: 4.4 10*3/uL (ref 1.7–7.7)
Neutrophils Relative %: 75 %

## 2021-12-08 LAB — COMPREHENSIVE METABOLIC PANEL
ALT: 15 U/L (ref 0–44)
AST: 21 U/L (ref 15–41)
Albumin: 4.2 g/dL (ref 3.5–5.0)
Alkaline Phosphatase: 68 U/L (ref 38–126)
Anion gap: 7 (ref 5–15)
BUN: 16 mg/dL (ref 8–23)
CO2: 26 mmol/L (ref 22–32)
Calcium: 9.3 mg/dL (ref 8.9–10.3)
Chloride: 105 mmol/L (ref 98–111)
Creatinine, Ser: 0.73 mg/dL (ref 0.61–1.24)
GFR, Estimated: >60 mL/min (ref >60)
Glucose, Bld: 116 mg/dL — ABNORMAL HIGH (ref 70–99)
Potassium: 4 mmol/L (ref 3.5–5.1)
Sodium: 138 mmol/L (ref 135–145)
Total Bilirubin: 1.2 mg/dL (ref 0.3–1.2)
Total Protein: 7.2 g/dL (ref 6.5–8.1)

## 2021-12-08 LAB — CBC
HCT: 42.6 % (ref 39.0–52.0)
Hemoglobin: 14.2 g/dL (ref 13.0–17.0)
MCH: 32.9 pg (ref 26.0–34.0)
MCHC: 33.3 g/dL (ref 30.0–36.0)
MCV: 98.8 fL (ref 80.0–100.0)
Platelets: 154 10*3/uL (ref 150–400)
RBC: 4.31 MIL/uL (ref 4.22–5.81)
RDW: 12.9 % (ref 11.5–15.5)
WBC: 6 10*3/uL (ref 4.0–10.5)
nRBC: 0 % (ref 0.0–0.2)

## 2021-12-08 LAB — CBG MONITORING, ED: Glucose-Capillary: 112 mg/dL — ABNORMAL HIGH (ref 70–99)

## 2021-12-08 LAB — APTT: aPTT: 35 seconds (ref 24–36)

## 2021-12-08 LAB — PROTIME-INR
INR: 1 (ref 0.8–1.2)
Prothrombin Time: 13 seconds (ref 11.4–15.2)

## 2021-12-08 LAB — ETHANOL: Alcohol, Ethyl (B): <10 mg/dL (ref <10)

## 2021-12-08 MED ORDER — CLOPIDOGREL BISULFATE 75 MG PO TABS
300.0000 mg | ORAL_TABLET | Freq: Once | ORAL | Status: AC
  Administered 2021-12-08: 300 mg via ORAL
  Filled 2021-12-08: qty 4

## 2021-12-08 MED ORDER — SODIUM CHLORIDE 0.9% FLUSH: 3.0000 mL | Freq: Once | INTRAVENOUS | Status: DC

## 2021-12-08 MED ORDER — CLOPIDOGREL BISULFATE 75 MG PO TABS: 75.0000 mg | ORAL_TABLET | Freq: Every day | ORAL | Status: DC

## 2021-12-08 NOTE — Consult Note (Signed)
[facility] Telemedicine Consult   Requesting Provider: Alben Deeds Consult Participants: Patient, bedside nurse, wife  This consult was provided via telemedicine with 2-way video and audio communication. The patient/family was informed that care would be provided in this way and agreed to receive care in this manner.    Chief Complaint: Difficulty Speaking  HPI: Mr. Lariccia is an 88 yo M with a history of hypertension who presents with a recurrent episode of word finding difficulty. His wife states taht he has now had several episodes of "speaking out of his head" that tend to improve overnight. He was hospitalized in November of 2022 with encephalopathy and then again earlier this month. The symptoms described from earlier this month are nearly identical to today's presentation. He had a negative MRI, CTA showed P2 occlusion, LDL was 97, and   He began having trouble around 4:30 pm, but has greatly  improved, he feels almost back to his baseline.    LKW: 1630 tpa given?: No, resolution of symptoms IR Thrombectomy? No, no LVO signs Time of teleneurologist evaluation: 18:39  Exam: Vitals:   12/08/21 1828 12/08/21 1830  BP: (!) 201/139   Pulse:  63  Resp:  17  Temp:  97.8 F (36.6 C)  SpO2:  97%    General: in bed, NAD  1A: Level of Consciousness - 0 1B: Ask Month and Age - 0 1C: 'Blink Eyes' & 'Squeeze Hands' - 0 2: Test Horizontal Extraocular Movements - 0 3: Test Visual Fields - 0 4: Test Facial Palsy - 0 5A: Test Left Arm Motor Drift - 0 5B: Test Right Arm Motor Drift - 0 6A: Test Left Leg Motor Drift - 0 6B: Test Right Leg Motor Drift - 0 7: Test Limb Ataxia - 0 8: Test Sensation - 0 9: Test Language/Aphasia- 1(very mild, occasional word finding difficulty, able to name objects on card, able to describe the cookie caper photo fluently) 10: Test Dysarthria - 0 11: Test Extinction/Inattention - 0 NIHSS score: 1   Imaging Reviewed: CT head - negative    Labs reviewed in epic and pertinent values follow: CBG 112   Assessment: 88 yo M with transient difficulty speaking. Possibilities include waxing/waning of underlying word finding difficulty, TIA/mild stroke, complex partial seizure, mild delirium. AS long as he continues to improve and remains at baseline, I doubt that readmission for TIA workup would be of benefit as he just had this done a couple of weeks ago.   Given that TIA is most likely, I would favor adding dual anti-platelet therapy with plavix x 3 weeks. He was supposed to have been started on lipitor at his last hospital stay as well, but I am not sure he is taking it. With stereotyped episodes, I think cardiac source would be very unlikely.   EEG will not be available tomorrow, and I think this could be pursued as an outpatient.   Recommendations:  1) plavix 75mg  daily x 3 weeks after 300mg  load 2) ensure he is taking atorvastatin 40mg  qhs 3) continue asa 81mg  daily  4) continue working with PCP on BP control 5) MRI brain, MRA head and neck.  6) consider EEG(ok as outpatient) 7) As long as above imaging is without acute findings, I think he would be ok to follow up as outpatient, with the understanding tha tshould symptoms recur, that he presents back to the ED.   , MD [facility] [phone]  If 7pm- 7am, please page neurology on call as  listed in Peter.

## 2021-12-08 NOTE — ED Provider Notes (Signed)
[facility] Provider Note    Event Date/Time   First MD Initiated Contact with Patient 12/08/21 1839     (approximate)  History   Chief Complaint: Hypertension  HPI  Carlos Frey is a 88 y.o. male with a past medical history of gastric reflux, hypertension, hyperlipidemia presents to the emergency department for difficulty speaking according to the wife today around 4 or 5:00 patient developed difficulty speaking.  Code stroke was called upon arrival.  Wife states the patient has been having trouble finding his words.  Upon further history wife states this is now the fourth or fifth time of the past 1 year that the symptoms have occurred.  States at times the patient will get confused and have speech difficulty which usually clears on its own.  Patient was just admitted to the hospital several weeks ago with similar symptoms had a TIA work-up that was essentially negative.  I reviewed the patient's discharge summary from 11/15/2021.  Essentially at that time had a negative work-up for TIA including an MRI which showed an old infarct but no acute infarct.  Physical Exam   Triage Vital Signs: ED Triage Vitals  Enc Vitals Group     BP 12/08/21 1828 (!) 201/139     Pulse Rate 12/08/21 1830 63     Resp 12/08/21 1830 17     Temp 12/08/21 1830 97.8 F (36.6 C)     Temp Source 12/08/21 1830 Oral     SpO2 12/08/21 1830 97 %     Weight --      Height --      Head Circumference --      Peak Flow --      Pain Score --      Pain Loc --      Pain Edu? --      Excl. in GC? --     Most recent vital signs: Vitals:   12/08/21 1852 12/08/21 1900  BP: (!) 195/104 (!) 159/104  Pulse: 74 78  Resp: 18 18  Temp:    SpO2: 95% 95%    General: Awake, no distress.  Does occasionally have trouble finding the correct word but is able to converse rather normally. CV:  Good peripheral perfusion.  Regular rate and rhythm  Resp:  Normal effort.  Equal breath sounds  bilaterally.  Abd:  No distention.  Soft, nontender.  No rebound or guarding. Other:  Equal grip strength bilaterally.  5/5 strength in all extremities.  No cranial nerve deficits.  No pronator drift.   ED Results / Procedures / Treatments   EKG  EKG viewed and interpreted by myself appears to show a sinus arrhythmia at 82 bpm with a narrow QRS normal axis.  Patient appears to have a very long PR interval consistent with prior EKGs including prior EKG of 05/04/2021.  No ST elevation or changes.  RADIOLOGY  I have reviewed the CT images.  No acute bleed on my evaluation/interpretation of the images. Radiology is read the CT scan is negative for acute infarct   MEDICATIONS ORDERED IN ED: Medications  sodium chloride flush (NS) 0.9 % injection 3 mL (3 mLs Intravenous Not Given 12/08/21 1907)  clopidogrel (PLAVIX) tablet 300 mg (has no administration in time range)  clopidogrel (PLAVIX) tablet 75 mg (has no administration in time range)     IMPRESSION / MDM / ASSESSMENT AND PLAN / ED COURSE  I reviewed the triage vital signs and the nursing notes.  Patient's presentation is most consistent with acute presentation with potential threat to life or bodily function.  Patient presents to the emergency department for difficulty speaking starting around 4 PM tonight however it has been intermittent over the past year or so.  On my examination patient does very rarely have trouble finding a word but is able to converse fairly normally.  No findings on my neurological exam.  Overall the patient appears well, no distress.  Code stroke was called upon arrival.  I spoke to Dr. Amada Jupiter of neurology who is also interviewed the patient.  States given the patient's significant work-up 1 month ago and a negative CT today he would like to proceed with an MRI today.  If the MRI is negative patient could potentially go home.  The remainder of the patient's work-up is pending including any causes for  encephalopathy from medical standpoint such as urinary tract infection metabolic or electrolyte abnormality.  Patient's lab work is so far nonrevealing with a normal CBC, normal chemistry, normal ethanol.  Urine is pending.  Patient is now experiencing hematuria with bleeding from his meatus.Patient is able to hold pressure and stop the bleeding however when he releases pressure even after 10 or 15 minutes or urinates it begins bleeding again.  Patients MRI shows no acute abnormality.  However given the patient's continued word finding difficulty and now hematuria with bleeding from his meatus I spoke to Dr. Irish Elders of urology who will consult on the patient in the morning we will admit to the hospital service for TIA work-up as well as now hematuria.  Patient has been seen by Dr. Amada Jupiter of neurology as well with whom I have spoken on the phone.  FINAL CLINICAL IMPRESSION(S) / ED DIAGNOSES   Difficulty speaking. Hematuria  Note:  This document was prepared using Dragon voice recognition software and may include unintentional dictation errors.   Minna Antis, MD 12/09/21 0000

## 2021-12-08 NOTE — Progress Notes (Signed)
1823 EDP seen time 1837 elert Amada Jupiter used same notification) 1839-on screen with Amada Jupiter

## 2021-12-08 NOTE — ED Notes (Signed)
Pt transported to MRI 

## 2021-12-08 NOTE — ED Triage Notes (Signed)
Pt comes pov with wife for htn, weakness, changes in speech. These symptoms have been off and on for a couple of weeks and he was seen recently for the same. Today the patient says these symptoms returned at about 5pm. Pt is searching for his words off and on. Code stroke called with dr Derrill Kay. LKW about 5pm.

## 2021-12-09 ENCOUNTER — Observation Stay: Payer: Medicare Other

## 2021-12-09 ENCOUNTER — Encounter: Payer: Self-pay | Admitting: Family Medicine

## 2021-12-09 DIAGNOSIS — N4889 Other specified disorders of penis: Secondary | ICD-10-CM | POA: Diagnosis not present

## 2021-12-09 DIAGNOSIS — I10 Essential (primary) hypertension: Secondary | ICD-10-CM

## 2021-12-09 DIAGNOSIS — I160 Hypertensive urgency: Secondary | ICD-10-CM | POA: Diagnosis not present

## 2021-12-09 DIAGNOSIS — G459 Transient cerebral ischemic attack, unspecified: Secondary | ICD-10-CM | POA: Diagnosis not present

## 2021-12-09 DIAGNOSIS — R319 Hematuria, unspecified: Secondary | ICD-10-CM | POA: Diagnosis not present

## 2021-12-09 DIAGNOSIS — R4702 Dysphasia: Secondary | ICD-10-CM

## 2021-12-09 DIAGNOSIS — E785 Hyperlipidemia, unspecified: Secondary | ICD-10-CM

## 2021-12-09 DIAGNOSIS — K219 Gastro-esophageal reflux disease without esophagitis: Secondary | ICD-10-CM

## 2021-12-09 LAB — LIPID PANEL
Cholesterol: 127 mg/dL (ref 0–200)
HDL: 28 mg/dL — ABNORMAL LOW (ref >40)
LDL Cholesterol: 81 mg/dL (ref 0–99)
Total CHOL/HDL Ratio: 4.5 RATIO
Triglycerides: 90 mg/dL (ref <150)
VLDL: 18 mg/dL (ref 0–40)

## 2021-12-09 LAB — URINALYSIS, COMPLETE (UACMP) WITH MICROSCOPIC
Bacteria, UA: NONE SEEN
Bilirubin Urine: NEGATIVE
Glucose, UA: NEGATIVE mg/dL
Ketones, ur: NEGATIVE mg/dL
Leukocytes,Ua: NEGATIVE
Nitrite: NEGATIVE
Protein, ur: NEGATIVE mg/dL
RBC / HPF: >50 RBC/hpf — ABNORMAL HIGH (ref 0–5)
Specific Gravity, Urine: 1.006 (ref 1.005–1.030)
Squamous Epithelial / HPF: NONE SEEN (ref 0–5)
pH: 9 — ABNORMAL HIGH (ref 5.0–8.0)

## 2021-12-09 MED ORDER — TRAZODONE HCL 50 MG PO TABS: 25.0000 mg | ORAL_TABLET | Freq: Every evening | ORAL | Status: DC | PRN

## 2021-12-09 MED ORDER — ATORVASTATIN CALCIUM 20 MG PO TABS
40.0000 mg | ORAL_TABLET | Freq: Every day | ORAL | Status: DC
  Administered 2021-12-09 – 2021-12-10 (×2): 40 mg via ORAL
  Filled 2021-12-09 (×2): qty 2

## 2021-12-09 MED ORDER — LISINOPRIL 20 MG PO TABS
20.0000 mg | ORAL_TABLET | Freq: Every day | ORAL | Status: DC
  Administered 2021-12-09 – 2021-12-10 (×2): 20 mg via ORAL
  Filled 2021-12-09: qty 1
  Filled 2021-12-09: qty 2

## 2021-12-09 MED ORDER — CLOPIDOGREL BISULFATE 75 MG PO TABS
75.0000 mg | ORAL_TABLET | Freq: Every day | ORAL | Status: DC
  Administered 2021-12-09: 75 mg via ORAL
  Filled 2021-12-09: qty 1

## 2021-12-09 MED ORDER — LISINOPRIL-HYDROCHLOROTHIAZIDE 10-12.5 MG PO TABS: 1.0000 | ORAL_TABLET | Freq: Every day | ORAL | Status: DC

## 2021-12-09 MED ORDER — MAGNESIUM HYDROXIDE 400 MG/5ML PO SUSP: 30.0000 mL | Freq: Every day | ORAL | Status: DC | PRN

## 2021-12-09 MED ORDER — IOHEXOL 300 MG/ML  SOLN
100.0000 mL | Freq: Once | INTRAMUSCULAR | Status: AC | PRN
  Administered 2021-12-09: 100 mL via INTRAVENOUS

## 2021-12-09 MED ORDER — PANTOPRAZOLE SODIUM 40 MG PO TBEC
40.0000 mg | DELAYED_RELEASE_TABLET | Freq: Every day | ORAL | Status: DC
  Administered 2021-12-09 – 2021-12-10 (×2): 40 mg via ORAL
  Filled 2021-12-09 (×2): qty 1

## 2021-12-09 MED ORDER — LORATADINE 10 MG PO TABS
10.0000 mg | ORAL_TABLET | Freq: Every day | ORAL | Status: DC
  Administered 2021-12-10: 10 mg via ORAL
  Filled 2021-12-09: qty 1

## 2021-12-09 MED ORDER — ACETAMINOPHEN 325 MG PO TABS: 650.0000 mg | ORAL_TABLET | Freq: Four times a day (QID) | ORAL | Status: DC | PRN

## 2021-12-09 MED ORDER — AMLODIPINE BESYLATE 5 MG PO TABS
5.0000 mg | ORAL_TABLET | Freq: Every day | ORAL | Status: DC
  Administered 2021-12-09 – 2021-12-10 (×2): 5 mg via ORAL
  Filled 2021-12-09 (×2): qty 1

## 2021-12-09 MED ORDER — ONDANSETRON HCL 4 MG PO TABS: 4.0000 mg | ORAL_TABLET | Freq: Four times a day (QID) | ORAL | Status: DC | PRN

## 2021-12-09 MED ORDER — STROKE: EARLY STAGES OF RECOVERY BOOK: Freq: Once | Status: AC

## 2021-12-09 MED ORDER — ONDANSETRON HCL 4 MG/2ML IJ SOLN: 4.0000 mg | Freq: Four times a day (QID) | INTRAMUSCULAR | Status: DC | PRN

## 2021-12-09 MED ORDER — ACETAMINOPHEN 650 MG RE SUPP: 650.0000 mg | Freq: Four times a day (QID) | RECTAL | Status: DC | PRN

## 2021-12-09 MED ORDER — SODIUM CHLORIDE 0.9 % IV SOLN: INTRAVENOUS | Status: DC

## 2021-12-09 MED ORDER — ASPIRIN 81 MG PO TBEC
81.0000 mg | DELAYED_RELEASE_TABLET | Freq: Every day | ORAL | Status: DC
Start: 2021-12-09 — End: 2021-12-10
  Administered 2021-12-09 – 2021-12-10 (×2): 81 mg via ORAL
  Filled 2021-12-09 (×2): qty 1

## 2021-12-09 NOTE — ED Notes (Signed)
John, tech assisted patient with using restroom

## 2021-12-09 NOTE — Assessment & Plan Note (Addendum)
-  The patient Initially had hypertensive emergency. - We will continue his antihypertensives and if he continues to have penile bleeding we will add as needed IV labetalol.

## 2021-12-09 NOTE — Progress Notes (Signed)
Brief hospitalist update note.  This is a non billable note.  Please see same day H&P for further details.  Briefly, this is a 72M seen for TIA who developed acute hematuria after DAPT was initiated.  Urology consulted.  Initially urine running clear, however after DAPT restarted, hematuria recurred.  Case d/w urology.  Will order CT urogram, keep overnight obs, re-evaluate in AM Remainder of care per H&P  Lolita Patella MD  No charge

## 2021-12-09 NOTE — H&P (Signed)
[place]   PATIENT NAME: Carlos Frey    MR#:  [id number]  DATE OF BIRTH:  09-24-1929  DATE OF ADMISSION:  12/08/2021  PRIMARY CARE PHYSICIAN: [name], MD   Patient is coming from: Home  REQUESTING/REFERRING PHYSICIAN: Minna Antis, MD  CHIEF COMPLAINT:   Chief Complaint  Patient presents with   Hypertension  Weakness all over, difficulty finding words.  HISTORY OF PRESENT ILLNESS:  Carlos Frey is a 88 y.o. male with medical history significant for GERD, hypertension, dyslipidemia and Barrett's esophagus, who presented to the emergency room with acute onset of expressive dysphasia started around 4 PM today.  He denies any other paresthesias or focal muscle weakness.  He stated that he felt weak all over and per his wife he had a feeling he was going to pass out.  No dysphagia or facial droop.  No tinnitus or vertigo.  No headache or dizziness or blurred vision.  No urinary or stool incontinence.  The patient had similar episodes over the last year for about 4-5 times.  It would usually last for about a day and resolves.  While he was in the ER is experienced penile bleeding that was briefly controlled with pressure.  He has been having dysuria today.  No fever or chills.  No nausea or vomiting or abdominal pain.  No chest pain or palpitations or dyspnea or cough or wheezing.  No nausea or vomiting or abdominal pain.  No other bleeding diathesis.  He was loaded with Plavix 300 mg p.o. earlier in the evening after having neurology consult  ED Course: He came to the ER, BP was 201/139 and later 157/66 with otherwise normal vital signs.  Labs revealed unremarkable CMP.  CBC was within normal.   EKG as reviewed by me :  EKG showed normal sinus rhythm with a rate of 71 with prolonged PR interval.  Repeat EKG showed accelerated junctional rhythm with PVCs and PACs.  I doubt atrial fibrillation in his case.  We will repeat more EKGs.  Imaging: Noncontrasted head  CT scan revealed no acute intracranial abnormalities and stable atrophy and white matter disease. Brain MRI with MRA of the head and neck revealed the following: MRI HEAD:   1. No acute intracranial infarct or other abnormality. 2. Age-related cerebral atrophy with chronic small vessel ischemic disease, with a few scattered remote lacunar infarcts involving the bilateral basal ganglia and left pons. Overall, appearance is stable as compared to recent MRI from 11/14/2021.   MRA HEAD:   1. Stable intracranial MRA. No large vessel occlusion or other emergent finding. 2. Focal severe right P2 stenosis, stable. 3. No other hemodynamically significant or correctable stenosis within the intracranial circulation.   MRA NECK:   1. Mild to moderate atheromatous change about the carotid bifurcations without hemodynamically significant greater than 50% stenosis. 2. Wide patency of both vertebral arteries within the neck. Right vertebral artery slightly dominant.  Dr. Amada Jupiter was consulted and evaluated patient as mentioned above.  Recommended Plavix for 3 weeks with 75 mg p.o. daily after 300 mg loading dose, continuing aspirin 81 mg p.o. daily and taking Lipitor 40 mg p.o. nightly.  Dr. Richardo Hanks was notified about the patient for his penile bleeding and will evaluate the patient in the morning.  The patient has taken aspirin and Plavix yesterday and will hold off for now pending further evaluation by Dr. Gabrielle Dare.  He will be admitted to an observation medical telemetry bed for further  evaluation and management.  PAST MEDICAL HISTORY:   Past Medical History:  Diagnosis Date   Barrett esophagus    GERD (gastroesophageal reflux disease)    High cholesterol    Hypertension     PAST SURGICAL HISTORY:   Past Surgical History:  Procedure Laterality Date   APPENDECTOMY     COLONOSCOPY  08/08/03, 12/05/08 & 12/14/13   ESOPHAGOGASTRODUODENOSCOPY  10/09/09, 12/25/09 & 03/24/12    ESOPHAGOGASTRODUODENOSCOPY (EGD) WITH PROPOFOL N/A 07/17/2017   Procedure: ESOPHAGOGASTRODUODENOSCOPY (EGD) WITH PROPOFOL;  Surgeon: Christena Deem, MD;  Location: [facility];  Service: Endoscopy;  Laterality: N/A;   TONSILLECTOMY      SOCIAL HISTORY:   Social History   Tobacco Use   Smoking status: Former    Types: Pipe, Cigars    Quit date: 1965    Years since quitting: 58.5   Smokeless tobacco: Never  Substance Use Topics   Alcohol use: No    FAMILY HISTORY:  No family history on file.  DRUG ALLERGIES:  No Known Allergies  REVIEW OF SYSTEMS:   ROS As per history of present illness. All pertinent systems were reviewed above. Constitutional, HEENT, cardiovascular, respiratory, GI, GU, musculoskeletal, neuro, psychiatric, endocrine, integumentary and hematologic systems were reviewed and are otherwise negative/unremarkable except for positive findings mentioned above in the HPI.   MEDICATIONS AT HOME:   Prior to Admission medications   Medication Sig Start Date End Date Taking? Authorizing Provider  amLODipine (NORVASC) 5 MG tablet Take 5 mg by mouth daily. 12/06/21  Yes [provider]  atorvastatin (LIPITOR) 40 MG tablet Take 1 tablet (40 mg total) by mouth daily. 11/15/21  Yes Sunnie Nielsen, DO  lisinopril (ZESTRIL) 20 MG tablet Take 20 mg by mouth daily. 11/29/21  Yes [provider]  lisinopril-hydrochlorothiazide (ZESTORETIC) 10-12.5 MG tablet Take 1 tablet by mouth daily. 11/15/21 11/15/22 Yes Sunnie Nielsen, DO  omeprazole (PRILOSEC) 20 MG capsule Take 20 mg by mouth daily.   Yes [provider]  aspirin EC 81 MG tablet Take 81 mg by mouth daily. Patient not taking: Reported on 11/14/2021    [provider]  cetirizine (ZYRTEC) 10 MG tablet Take 10 mg by mouth daily.    [provider]      VITAL SIGNS:  Blood pressure (!) 171/75, pulse 63, temperature 97.8 F (36.6 C), temperature source Oral, resp. rate 20,  SpO2 94 %.  PHYSICAL EXAMINATION:  Physical Exam  GENERAL:  88 y.o.-year-old Caucasian male patient lying in the bed with no acute distress.  EYES: Pupils equal, round, reactive to light and accommodation. No scleral icterus. Extraocular muscles intact.  HEENT: Head atraumatic, normocephalic. Oropharynx and nasopharynx clear.  NECK:  Supple, no jugular venous distention. No thyroid enlargement, no tenderness.  LUNGS: Normal breath sounds bilaterally, no wheezing, rales,rhonchi or crepitation. No use of accessory muscles of respiration.  CARDIOVASCULAR: Regular rate and rhythm, S1, S2 normal. No murmurs, rubs, or gallops.  ABDOMEN: Soft, nondistended, nontender. Bowel sounds present. No organomegaly or mass.  EXTREMITIES: No pedal edema, cyanosis, or clubbing.  NEUROLOGIC: Cranial nerves II through XII are intact. Muscle strength 5/5 in all extremities. Sensation intact. Gait not checked.  PSYCHIATRIC: The patient is alert and oriented x 3.  Normal affect and good eye contact. SKIN: No obvious rash, lesion, or ulcer.   LABORATORY PANEL:   CBC Recent Labs  Lab 12/08/21 1845  WBC 6.0  HGB 14.2  HCT 42.6  PLT 154   ------------------------------------------------------------------------------------------------------------------  Chemistries  Recent  Labs  Lab 12/08/21 1845  NA 138  K 4.0  CL 105  CO2 26  GLUCOSE 116*  BUN 16  CREATININE 0.73  CALCIUM 9.3  AST 21  ALT 15  ALKPHOS 68  BILITOT 1.2   ------------------------------------------------------------------------------------------------------------------  Cardiac Enzymes No results for input(s): "TROPONINI" in the last 168 hours. ------------------------------------------------------------------------------------------------------------------  RADIOLOGY:  MR BRAIN WO CONTRAST  Result Date: 12/08/2021 CLINICAL DATA:  Initial evaluation for neuro deficit, stroke suspected. EXAM: MRI HEAD WITHOUT CONTRAST MRA HEAD  WITHOUT CONTRAST MRA NECK WITHOUT CONTRAST TECHNIQUE: Multiplanar, multiecho pulse sequences of the brain and surrounding structures were obtained without intravenous contrast. Angiographic images of the Circle of Willis were obtained using MRA technique without intravenous contrast. Angiographic images of the neck were obtained using MRA technique without intravenous contrast. Carotid stenosis measurements (when applicable) are obtained utilizing NASCET criteria, using the distal internal carotid diameter as the denominator. COMPARISON:  Prior CT from earlier the same day as well as recent exam from 11/14/2021. FINDINGS: MRI HEAD FINDINGS Brain: Generalized age-related cerebral atrophy. Patchy and confluent T2/FLAIR hyperintensity involving the periventricular deep white matter both cerebral hemispheres, most consistent with chronic small vessel ischemic disease, mild to moderate for age. Mild patchy involvement of the pons. Few scattered remote lacunar infarcts present about the bilateral basal ganglia and left pons. Associated chronic hemosiderin staining noted about a few of these infarcts. Appearance is stable from recent MRI. No evidence for acute or subacute infarct. Gray-white matter differentiation maintained. No areas of chronic cortical infarction. No acute intracranial hemorrhage. No mass lesion, midline shift or mass effect no hydrocephalus or extra-axial fluid collection. Pituitary gland suprasellar region normal. Midline structures intact and normally formed. Vascular: Major intracranial vascular flow voids are maintained. Asymmetric FLAIR signal intensity within the left transverse sinus without T1 correlate, consistent with slow/sluggish flow, similar to prior. Skull and upper cervical spine: Craniocervical junction within normal limits. Bone marrow signal intensity normal. No scalp soft tissue abnormality. Sinuses/Orbits: Globes and orbital soft tissues within normal limits. Paranasal sinuses are  largely clear. No mastoid effusion. Other: None. MRA HEAD FINDINGS ANTERIOR CIRCULATION: Visualized distal cervical segments of the internal carotid arteries are patent with antegrade flow. Petrous segments patent bilaterally. Mild atheromatous irregularity within the carotid siphons without significant stenosis. A1 segments patent bilaterally. Normal anterior communicating artery complex. Anterior cerebral arteries widely patent. No M1 stenosis or occlusion. Normal MCA bifurcations. No proximal MCA branch occlusion. Distal MCA branches perfused and symmetric. POSTERIOR CIRCULATION: Both vertebral arteries widely patent to the vertebrobasilar junction. Right vertebral artery dominant. Left PICA patent proximally. Right PICA not well seen. Basilar patent to its distal aspect without stenosis. Superior cerebral arteries patent bilaterally. Both PCAs primarily supplied via the basilar. Left PCA widely patent to its distal aspect. Focal severe right P2 stenosis noted, stable (series 5, image 128). Right PCA mildly attenuated but patent distally. No intracranial aneurysm. MRA NECK FINDINGS AORTIC ARCH: Examination degraded by motion. Visualized aortic arch normal caliber with standard 3 vessel morphology. No stenosis about the origin of the great vessels. RIGHT CAROTID SYSTEM: Right common and internal carotid arteries are mildly tortuous. Mild-to-moderate atheromatous change about the right carotid bulb without hemodynamically significant stenosis. No dissection or other acute finding. LEFT CAROTID SYSTEM: Left common and internal carotid arteries are mildly tortuous. Mild-to-moderate atheromatous change about the left carotid bulb/proximal left ICA without hemodynamically significant stenosis. No dissection or other acute finding. VERTEBRAL ARTERIES: Both vertebral arteries arise from subclavian arteries. No visible proximal subclavian  artery stenosis. Right vertebral artery slightly dominant. Visualized vertebral  arteries patent without stenosis or evidence for dissection. IMPRESSION: MRI HEAD: 1. No acute intracranial infarct or other abnormality. 2. Age-related cerebral atrophy with chronic small vessel ischemic disease, with a few scattered remote lacunar infarcts involving the bilateral basal ganglia and left pons. Overall, appearance is stable as compared to recent MRI from 11/14/2021. MRA HEAD: 1. Stable intracranial MRA. No large vessel occlusion or other emergent finding. 2. Focal severe right P2 stenosis, stable. 3. No other hemodynamically significant or correctable stenosis within the intracranial circulation. MRA NECK: 1. Mild to moderate atheromatous change about the carotid bifurcations without hemodynamically significant greater than 50% stenosis. 2. Wide patency of both vertebral arteries within the neck. Right vertebral artery slightly dominant. Electronically Signed   By: Rise Mu M.D.   On: 12/08/2021 21:05   MR ANGIO HEAD WO CONTRAST  Result Date: 12/08/2021 CLINICAL DATA:  Initial evaluation for neuro deficit, stroke suspected. EXAM: MRI HEAD WITHOUT CONTRAST MRA HEAD WITHOUT CONTRAST MRA NECK WITHOUT CONTRAST TECHNIQUE: Multiplanar, multiecho pulse sequences of the brain and surrounding structures were obtained without intravenous contrast. Angiographic images of the Circle of Willis were obtained using MRA technique without intravenous contrast. Angiographic images of the neck were obtained using MRA technique without intravenous contrast. Carotid stenosis measurements (when applicable) are obtained utilizing NASCET criteria, using the distal internal carotid diameter as the denominator. COMPARISON:  Prior CT from earlier the same day as well as recent exam from 11/14/2021. FINDINGS: MRI HEAD FINDINGS Brain: Generalized age-related cerebral atrophy. Patchy and confluent T2/FLAIR hyperintensity involving the periventricular deep white matter both cerebral hemispheres, most consistent with  chronic small vessel ischemic disease, mild to moderate for age. Mild patchy involvement of the pons. Few scattered remote lacunar infarcts present about the bilateral basal ganglia and left pons. Associated chronic hemosiderin staining noted about a few of these infarcts. Appearance is stable from recent MRI. No evidence for acute or subacute infarct. Gray-white matter differentiation maintained. No areas of chronic cortical infarction. No acute intracranial hemorrhage. No mass lesion, midline shift or mass effect no hydrocephalus or extra-axial fluid collection. Pituitary gland suprasellar region normal. Midline structures intact and normally formed. Vascular: Major intracranial vascular flow voids are maintained. Asymmetric FLAIR signal intensity within the left transverse sinus without T1 correlate, consistent with slow/sluggish flow, similar to prior. Skull and upper cervical spine: Craniocervical junction within normal limits. Bone marrow signal intensity normal. No scalp soft tissue abnormality. Sinuses/Orbits: Globes and orbital soft tissues within normal limits. Paranasal sinuses are largely clear. No mastoid effusion. Other: None. MRA HEAD FINDINGS ANTERIOR CIRCULATION: Visualized distal cervical segments of the internal carotid arteries are patent with antegrade flow. Petrous segments patent bilaterally. Mild atheromatous irregularity within the carotid siphons without significant stenosis. A1 segments patent bilaterally. Normal anterior communicating artery complex. Anterior cerebral arteries widely patent. No M1 stenosis or occlusion. Normal MCA bifurcations. No proximal MCA branch occlusion. Distal MCA branches perfused and symmetric. POSTERIOR CIRCULATION: Both vertebral arteries widely patent to the vertebrobasilar junction. Right vertebral artery dominant. Left PICA patent proximally. Right PICA not well seen. Basilar patent to its distal aspect without stenosis. Superior cerebral arteries patent  bilaterally. Both PCAs primarily supplied via the basilar. Left PCA widely patent to its distal aspect. Focal severe right P2 stenosis noted, stable (series 5, image 128). Right PCA mildly attenuated but patent distally. No intracranial aneurysm. MRA NECK FINDINGS AORTIC ARCH: Examination degraded by motion. Visualized aortic arch normal caliber with  standard 3 vessel morphology. No stenosis about the origin of the great vessels. RIGHT CAROTID SYSTEM: Right common and internal carotid arteries are mildly tortuous. Mild-to-moderate atheromatous change about the right carotid bulb without hemodynamically significant stenosis. No dissection or other acute finding. LEFT CAROTID SYSTEM: Left common and internal carotid arteries are mildly tortuous. Mild-to-moderate atheromatous change about the left carotid bulb/proximal left ICA without hemodynamically significant stenosis. No dissection or other acute finding. VERTEBRAL ARTERIES: Both vertebral arteries arise from subclavian arteries. No visible proximal subclavian artery stenosis. Right vertebral artery slightly dominant. Visualized vertebral arteries patent without stenosis or evidence for dissection. IMPRESSION: MRI HEAD: 1. No acute intracranial infarct or other abnormality. 2. Age-related cerebral atrophy with chronic small vessel ischemic disease, with a few scattered remote lacunar infarcts involving the bilateral basal ganglia and left pons. Overall, appearance is stable as compared to recent MRI from 11/14/2021. MRA HEAD: 1. Stable intracranial MRA. No large vessel occlusion or other emergent finding. 2. Focal severe right P2 stenosis, stable. 3. No other hemodynamically significant or correctable stenosis within the intracranial circulation. MRA NECK: 1. Mild to moderate atheromatous change about the carotid bifurcations without hemodynamically significant greater than 50% stenosis. 2. Wide patency of both vertebral arteries within the neck. Right vertebral  artery slightly dominant. Electronically Signed   By: Rise Mu M.D.   On: 12/08/2021 21:05   MR ANGIO NECK WO CONTRAST  Result Date: 12/08/2021 CLINICAL DATA:  Initial evaluation for neuro deficit, stroke suspected. EXAM: MRI HEAD WITHOUT CONTRAST MRA HEAD WITHOUT CONTRAST MRA NECK WITHOUT CONTRAST TECHNIQUE: Multiplanar, multiecho pulse sequences of the brain and surrounding structures were obtained without intravenous contrast. Angiographic images of the Circle of Willis were obtained using MRA technique without intravenous contrast. Angiographic images of the neck were obtained using MRA technique without intravenous contrast. Carotid stenosis measurements (when applicable) are obtained utilizing NASCET criteria, using the distal internal carotid diameter as the denominator. COMPARISON:  Prior CT from earlier the same day as well as recent exam from 11/14/2021. FINDINGS: MRI HEAD FINDINGS Brain: Generalized age-related cerebral atrophy. Patchy and confluent T2/FLAIR hyperintensity involving the periventricular deep white matter both cerebral hemispheres, most consistent with chronic small vessel ischemic disease, mild to moderate for age. Mild patchy involvement of the pons. Few scattered remote lacunar infarcts present about the bilateral basal ganglia and left pons. Associated chronic hemosiderin staining noted about a few of these infarcts. Appearance is stable from recent MRI. No evidence for acute or subacute infarct. Gray-white matter differentiation maintained. No areas of chronic cortical infarction. No acute intracranial hemorrhage. No mass lesion, midline shift or mass effect no hydrocephalus or extra-axial fluid collection. Pituitary gland suprasellar region normal. Midline structures intact and normally formed. Vascular: Major intracranial vascular flow voids are maintained. Asymmetric FLAIR signal intensity within the left transverse sinus without T1 correlate, consistent with  slow/sluggish flow, similar to prior. Skull and upper cervical spine: Craniocervical junction within normal limits. Bone marrow signal intensity normal. No scalp soft tissue abnormality. Sinuses/Orbits: Globes and orbital soft tissues within normal limits. Paranasal sinuses are largely clear. No mastoid effusion. Other: None. MRA HEAD FINDINGS ANTERIOR CIRCULATION: Visualized distal cervical segments of the internal carotid arteries are patent with antegrade flow. Petrous segments patent bilaterally. Mild atheromatous irregularity within the carotid siphons without significant stenosis. A1 segments patent bilaterally. Normal anterior communicating artery complex. Anterior cerebral arteries widely patent. No M1 stenosis or occlusion. Normal MCA bifurcations. No proximal MCA branch occlusion. Distal MCA branches perfused and symmetric. POSTERIOR  CIRCULATION: Both vertebral arteries widely patent to the vertebrobasilar junction. Right vertebral artery dominant. Left PICA patent proximally. Right PICA not well seen. Basilar patent to its distal aspect without stenosis. Superior cerebral arteries patent bilaterally. Both PCAs primarily supplied via the basilar. Left PCA widely patent to its distal aspect. Focal severe right P2 stenosis noted, stable (series 5, image 128). Right PCA mildly attenuated but patent distally. No intracranial aneurysm. MRA NECK FINDINGS AORTIC ARCH: Examination degraded by motion. Visualized aortic arch normal caliber with standard 3 vessel morphology. No stenosis about the origin of the great vessels. RIGHT CAROTID SYSTEM: Right common and internal carotid arteries are mildly tortuous. Mild-to-moderate atheromatous change about the right carotid bulb without hemodynamically significant stenosis. No dissection or other acute finding. LEFT CAROTID SYSTEM: Left common and internal carotid arteries are mildly tortuous. Mild-to-moderate atheromatous change about the left carotid bulb/proximal left  ICA without hemodynamically significant stenosis. No dissection or other acute finding. VERTEBRAL ARTERIES: Both vertebral arteries arise from subclavian arteries. No visible proximal subclavian artery stenosis. Right vertebral artery slightly dominant. Visualized vertebral arteries patent without stenosis or evidence for dissection. IMPRESSION: MRI HEAD: 1. No acute intracranial infarct or other abnormality. 2. Age-related cerebral atrophy with chronic small vessel ischemic disease, with a few scattered remote lacunar infarcts involving the bilateral basal ganglia and left pons. Overall, appearance is stable as compared to recent MRI from 11/14/2021. MRA HEAD: 1. Stable intracranial MRA. No large vessel occlusion or other emergent finding. 2. Focal severe right P2 stenosis, stable. 3. No other hemodynamically significant or correctable stenosis within the intracranial circulation. MRA NECK: 1. Mild to moderate atheromatous change about the carotid bifurcations without hemodynamically significant greater than 50% stenosis. 2. Wide patency of both vertebral arteries within the neck. Right vertebral artery slightly dominant. Electronically Signed   By: Rise MuBenjamin  McClintock M.D.   On: 12/08/2021 21:05   CT HEAD CODE STROKE WO CONTRAST  Result Date: 12/08/2021 CLINICAL DATA:  Code stroke. Neuro deficit, acute, stroke suspected. Weakness. Abnormal speech. Patient has had similar symptoms intermittently. Symptoms began 1.5 hours ago. EXAM: CT HEAD WITHOUT CONTRAST TECHNIQUE: Contiguous axial images were obtained from the base of the skull through the vertex without intravenous contrast. RADIATION DOSE REDUCTION: This exam was performed according to the departmental dose-optimization program which includes automated exposure control, adjustment of the mA and/or kV according to patient size and/or use of iterative reconstruction technique. COMPARISON:  CT head and MR head 11/14/2021 FINDINGS: Brain: Moderate atrophy and  white matter changes are stable. Remote lacunar infarcts are present in the left external capsule. No acute infarct, hemorrhage, or mass lesion is present. The ventricles are of normal size. No significant extraaxial fluid collection is present. Vascular: Atherosclerotic calcifications are present within the cavernous internal carotid arteries bilaterally. No hyperdense vessel is present. Skull: Calvarium is intact. No focal lytic or blastic lesions are present. No significant extracranial soft tissue lesion is present. Sinuses/Orbits: The paranasal sinuses and mastoid air cells are clear. The globes and orbits are within normal limits. ASPECTS [facility]([place] Stroke Program Early CT Score) - Ganglionic level infarction (caudate, lentiform nuclei, internal capsule, insula, M1-M3 cortex): 7/7 - Supraganglionic infarction (M4-M6 cortex): 3/3 Total score (0-10 with 10 being normal): 10/10 IMPRESSION: 1. No acute intracranial abnormality or significant interval change. 2. Stable atrophy and white matter disease. This likely reflects the sequela of chronic microvascular ischemia. *ASPECTS is 10/10 * * The above was relayed via text pager to Dr. Amada JupiterKirkpatrick on 12/08/2021 at 18:45 .  Electronically Signed   By: Marin Roberts M.D.   On: 12/08/2021 18:45      IMPRESSION AND PLAN:  Assessment and Plan: * TIA (transient ischemic attack) - This is manifested by expressive dysphasia. - The patient be admitted to a medical telemetry observation bed. - We will obtain a 2D echo with bubble study in AM. - We will follow neurochecks every 4 hours for 24 hours. - ST consult will be obtained. - We will hold off aspirin and Plavix as the patient symptoms have resolved and he continues to have active penile bleeding. - He had a neurology consult by Dr. Amada Jupiter.  Penile bleeding - The patient will be evaluated by Dr. Victorio Palm in AM. - We will hold off aspirin and Plavix at this time pending urology evaluation.  He had  his doses on 7/1. - We will follow serial H&H. - A urinalysis is currently pending especially given his dysuria.  Essential hypertension -The patient Initially had hypertensive emergency. - We will continue his antihypertensives and if he continues to have penile bleeding we will add as needed IV labetalol.  GERD (gastroesophageal reflux disease) - We will continue PPI therapy.  Dyslipidemia - We will continue statin therapy.   We will repeat more EKGs as an monitor the patient given suspicion about atrial fibrillation on EKG.  DVT prophylaxis: SCDs.  Advanced Care Planning:  Code Status: full code. Family Communication:  The plan of care was discussed in details with the patient (and family). I answered all questions. The patient agreed to proceed with the above mentioned plan. Further management will depend upon hospital course. Disposition Plan: Back to previous home environment Consults called: Neurology and urology. All the records are reviewed and case discussed with ED provider.  Status is: Observation  At the time of the admission, it appears that the appropriate admission status for this patient is inpatient.  This is judged to be reasonable and necessary in order to provide the required intensity of service to ensure the patient's safety given the presenting symptoms, physical exam findings and initial radiographic and laboratory data in the context of comorbid conditions.  The patient requires inpatient status due to high intensity of service, high risk of further deterioration and high frequency of surveillance required.  I certify that at the time of admission, it is my clinical judgment that the patient will require  hospital care extending less than 2 midnights.                            Dispo: The patient is from: Home              Anticipated d/c is to: Home              Patient currently is not medically stable to d/c.              Difficult to place patient:  No  Hannah Beat M.D on 12/09/2021 at 1:01 AM  [facility]   From 7 PM-7 AM, contact night-coverage [email or web address]  CC: Primary care physician; [name], MD

## 2021-12-09 NOTE — Evaluation (Signed)
Physical Therapy Evaluation Patient Details Name: Carlos Frey MRN: [id number] DOB: [birth year] Today's Date: 12/09/2021  History of Present Illness  Patient is an 88 year old male who presents to the ED with acute onset of expressive dyphasia starting around 4pm on 12/08/21. Per patient he felt weak all over, has had this in the past year approximately 4-5 times. While in the ED patient expereienc penile bleed. PMH  includes GERD, HTN, dyslipidemia, Barrett's esophagus. Was d/c from hospital on 11/15/21 for similiar event.  Clinical Impression  Patient is a pleasant 88 year old male who presents with generalized weakness and instability. Prior to hospital admission, pt was independent/very active and lives in a one story home with 4-5 steps to enter with wife. Patient was mowing his yard and chopping a tree prior to recent admission.  Currently pt is unstable with limited LE strength.   Patient is in bed upon PT arrival and agreeable to participate with therapist. Patient is highly motivated to return to baseline. He requires additional time to transition to EOB. Upon EOB he is able to stand to buckle pants without assistance. Upon ambulating he is unstable initially but improves with continuation of ambulation. He does have limited stability with head turns and turning in hallway indicating area of focus in outpatient setting. Patient returned to bed with needs met and nursing notified. Pt would benefit from skilled PT to address noted impairments and functional limitations (see below for any additional details).  Upon hospital discharge, pt would benefit from outpatient physical therapy.        Recommendations for follow up therapy are one component of a multi-disciplinary discharge planning process, led by the attending physician.  Recommendations may be updated based on patient status, additional functional criteria and insurance authorization.  Follow Up Recommendations Outpatient PT       Assistance Recommended at Discharge None  Patient can return home with the following  Assistance with cooking/housework    Equipment Recommendations None recommended by PT  Recommendations for Other Services       Functional Status Assessment Patient has had a recent decline in their functional status and demonstrates the ability to make significant improvements in function in a reasonable and predictable amount of time.     Precautions / Restrictions Precautions Precautions: Fall Restrictions Weight Bearing Restrictions: No      Mobility  Bed Mobility Overal bed mobility: Modified Independent             General bed mobility comments: Requires additional time to get in/out of bed ; no assistance required    Transfers Overall transfer level: Modified independent                 General transfer comment: Supervision without AD    Ambulation/Gait Ambulation/Gait assistance: Min guard Gait Distance (Feet): 100 Feet Assistive device: None Gait Pattern/deviations: Step-through pattern, Narrow base of support, Trunk flexed, Shuffle Gait velocity: decreased     General Gait Details: Patient initially has staggering shuffle steps with narrow BOS improves with duration of ambulation but still slightly unsteady.  Stairs            Wheelchair Mobility    Modified Rankin (Stroke Patients Only)       Balance Overall balance assessment: Mild deficits observed, not formally tested (limited stability with turns and head movement)  Pertinent Vitals/Pain Pain Assessment Pain Assessment: No/denies pain    Home Living Family/patient expects to be discharged to:: Private residence Living Arrangements: Spouse/significant other Available Help at Discharge: Family;Available 24 hours/day Type of Home: House Home Access: Stairs to enter Entrance Stairs-Rails: Left;Can reach both Entrance  Stairs-Number of Steps: 4-5 stairs   Home Layout: One level Home Equipment: Conservation officer, nature (2 wheels);Cane - single point Additional Comments: Owns DME but does not use either at baseline.    Prior Function Prior Level of Function : Independent/Modified Independent             Mobility Comments: Pt is very active and fully independent at baseline. Denies history of falls. Likes to mow his yard and work outside ADLs Comments: Independent     Hand Dominance   Dominant Hand: Right    Extremity/Trunk Assessment   Upper Extremity Assessment Upper Extremity Assessment: Overall WFL for tasks assessed    Lower Extremity Assessment Lower Extremity Assessment: Generalized weakness (grossly 4-/5 bilaterally)    Cervical / Trunk Assessment Cervical / Trunk Assessment: Normal  Communication   Communication: No difficulties  Cognition Arousal/Alertness: Awake/alert Behavior During Therapy: WFL for tasks assessed/performed Overall Cognitive Status: Within Functional Limits for tasks assessed                                 General Comments: A and O x 4        General Comments General comments (skin integrity, edema, etc.): Patient appears well nourished and groomed    Exercises Other Exercises Other Exercises: Patient educated on role of PT in acute are setting, safe mobility and transfers.   Assessment/Plan    PT Assessment Patient needs continued PT services  PT Problem List Decreased strength;Decreased activity tolerance;Decreased balance;Decreased mobility       PT Treatment Interventions DME instruction;Gait training;Stair training;Functional mobility training;Therapeutic activities;Therapeutic exercise;Patient/family education;Neuromuscular re-education;Balance training    PT Goals (Current goals can be found in the Care Plan section)  Acute Rehab PT Goals Patient Stated Goal: to get stronger and more steady PT Goal Formulation: With patient Time  For Goal Achievement: 12/23/21 Potential to Achieve Goals: Fair    Frequency Min 2X/week     Co-evaluation               AM-PAC PT "6 Clicks" Mobility  Outcome Measure Help needed turning from your back to your side while in a flat bed without using bedrails?: None Help needed moving from lying on your back to sitting on the side of a flat bed without using bedrails?: A Little Help needed moving to and from a bed to a chair (including a wheelchair)?: None Help needed standing up from a chair using your arms (e.g., wheelchair or bedside chair)?: None Help needed to walk in hospital room?: A Little Help needed climbing 3-5 steps with a railing? : A Little 6 Click Score: 21    End of Session Equipment Utilized During Treatment: Gait belt Activity Tolerance: Patient tolerated treatment well;Patient limited by fatigue Patient left: in bed Nurse Communication: Mobility status PT Visit Diagnosis: Unsteadiness on feet (R26.81);Other abnormalities of gait and mobility (R26.89);Muscle weakness (generalized) (M62.81);Difficulty in walking, not elsewhere classified (R26.2)    Time: 9528-4132 PT Time Calculation (min) (ACUTE ONLY): 24 min   Charges:   PT Evaluation $PT Eval Moderate Complexity: 1 Mod PT Treatments $Therapeutic Activity: 8-22 mins        Lenda Kelp  Curlee Bogan, PT, DPT  12/09/2021, 9:45 AM

## 2021-12-09 NOTE — Progress Notes (Signed)
Unfortunately, patient did not tolerate dual antiplatelet therapy due to penile bleeding. Urology is following and recommending aspirin alone at this time.  The data for dual antiplatelet therapy suggests that the biggest benefit is in the first week, and after three weeks, there is limited additional benefit, except in cases where there is a culprit stenosis, in which case we extend therapy out to 3 months. His severe intracrania l stenosis is on the wrong side to explain his symptoms, which is why Dr. Amada Jupiter recommended 21 days. Of course if he is having hematuria and cannot tolerate dual antiplatelet therapy it is fine to hold it and use aspirin alone. When DAPT is started, I would have the end date be 21 days from the initial symptom onset (July 22nd). If he has recurrent symptoms concerning for TIA, Neurology should be consulted and this clock may be re started.  Discussed with urology and primary team via secure chat.

## 2021-12-09 NOTE — Evaluation (Signed)
Speech Language Pathology Evaluation Patient Details Name: Carlos Frey MRN: [id number] DOB: March 31, 1930 Today's Date: 12/09/2021 Time: 1914-7829 SLP Time Calculation (min) (ACUTE ONLY): 10 min  Problem List:  Patient Active Problem List   Diagnosis Date Noted   TIA (transient ischemic attack) 12/09/2021   Penile bleeding 12/09/2021   Essential hypertension 12/09/2021   Dyslipidemia 12/09/2021   GERD (gastroesophageal reflux disease) 12/09/2021   Abnormal central nervous system diagnostic imaging 11/15/2021   Aphasia 11/14/2021   Dizziness 11/14/2021   Acute confusion 05/04/2021   Rhinovirus infection    1st degree AV block    Near syncope 04/05/2018   Benign essential hypertension 04/05/2014   Barrett's esophagus determined by biopsy 11/23/2013   Past Medical History:  Past Medical History:  Diagnosis Date   Barrett esophagus    GERD (gastroesophageal reflux disease)    High cholesterol    Hypertension    Past Surgical History:  Past Surgical History:  Procedure Laterality Date   APPENDECTOMY     COLONOSCOPY  08/08/03, 12/05/08 & 12/14/13   ESOPHAGOGASTRODUODENOSCOPY  10/09/09, 12/25/09 & 03/24/12   ESOPHAGOGASTRODUODENOSCOPY (EGD) WITH PROPOFOL N/A 07/17/2017   Procedure: ESOPHAGOGASTRODUODENOSCOPY (EGD) WITH PROPOFOL;  Surgeon: Christena Deem, MD;  Location: [facility] ENDOSCOPY;  Service: Endoscopy;  Laterality: N/A;   TONSILLECTOMY     HPI:  Per H&P "Carlos Frey is a 88 y.o. male with medical history significant for GERD, hypertension, dyslipidemia and Barrett's esophagus, who presented to the emergency room with acute onset of expressive dysphasia started around 4 PM today. He denies any other paresthesias or focal muscle weakness. He stated that he felt weak all over and per his wife he had a feeling he was going to pass out.  No dysphagia or facial droop. No tinnitus or vertigo. No headache or dizziness or blurred vision. No urinary or stool incontinence. The patient  had similar episodes over the last year for about 4-5 times. It would usually last for about a day and resolves. While he was in the ER is experienced penile bleeding that was briefly controlled with pressure. He has been having dysuria today.  No fever or chills. No nausea or vomiting or abdominal pain.  No chest pain or palpitations or dyspnea or cough or wheezing. No nausea or vomiting or abdominal pain. No other bleeding diathesis. He was loaded with Plavix 300 mg p.o. earlier in the evening after having neurology consult     ED Course: He came to the ER, BP was 201/139 and later 157/66 with otherwise normal vital signs.  Labs revealed unremarkable CMP.  CBC was within normal.    EKG as reviewed by me :  EKG showed normal sinus rhythm with a rate of 71 with prolonged PR interval.  Repeat EKG showed accelerated junctional rhythm with PVCs and PACs.  I doubt atrial fibrillation in his case.  We will repeat more EKGs.     Imaging: Noncontrasted head CT scan revealed no acute intracranial abnormalities and stable atrophy and white matter disease.  Brain MRI with MRA of the head and neck revealed the following:  MRI HEAD:     1. No acute intracranial infarct or other abnormality.  2. Age-related cerebral atrophy with chronic small vessel ischemic  disease, with a few scattered remote lacunar infarcts involving the  bilateral basal ganglia and left pons. Overall, appearance is stable  as compared to recent MRI from 11/14/2021.     MRA HEAD:     1. Stable intracranial MRA.  No large vessel occlusion or other  emergent finding.  2. Focal severe right P2 stenosis, stable.  3. No other hemodynamically significant or correctable stenosis  within the intracranial circulation.     MRA NECK:     1. Mild to moderate atheromatous change about the carotid  bifurcations without hemodynamically significant greater than 50%  stenosis.  2. Wide patency of both vertebral arteries within the neck. Right  vertebral artery slightly  dominant."   Assessment / Plan / Recommendation Clinical Impression  Pt seen for speech/language evaluation. Pt alert, pleasant, and cooperative. Sons at bedside. Cleared with RN.  Evaluation completed via informal means and portions of Western Aphasia Battery - Revised (Bedside Record Forms). Pt demonstrated intact verbal expressive language. Speech is fluent, appropriate, and without s/sx anomia or dysarthria. Pt's auditory comprehension was grossly WFL. Pt benefited from extra time and self-correction with complex yes/no questions as well as extra time for 2-step commands. Pt A&Ox4 and cognition appeared functional for participation in speech/language evaluation.   Pt and son's endorse pt's speech/language is at baseline.   SLP to sign off as pt has no acute SLP needs at this time.   Pt and sons made aware of results, recommendations, and SLP POC. All verbalized understanding/agreement.    SLP Assessment  SLP Visit Diagnosis: Aphasia (R47.01)    Recommendations for follow up therapy are one component of a multi-disciplinary discharge planning process, led by the attending physician.  Recommendations may be updated based on patient status, additional functional criteria and insurance authorization.    Follow Up Recommendations  No SLP follow up    Assistance Recommended at Discharge  PRN  Functional Status Assessment Patient has not had a recent decline in their functional status        SLP Evaluation Cognition  Overall Cognitive Status: Within Functional Limits for tasks assessed Orientation Level: Oriented X4       Comprehension  Auditory Comprehension Overall Auditory Comprehension: Appears within functional limits for tasks assessed Yes/No Questions: Within Functional Limits (extra time and self-correction with complex yes/no) Commands: Within Functional Limits (extra time for 2-step) Conversation: Simple EffectiveTechniques: Extra processing time;Repetition Visual  Recognition/Discrimination Discrimination: Not tested Reading Comprehension Reading Status: Not tested    Expression Expression Primary Mode of Expression: Verbal Verbal Expression Overall Verbal Expression: Appears within functional limits for tasks assessed Initiation: No impairment Automatic Speech:  (social automatic speech) Level of Generative/Spontaneous Verbalization: Conversation Repetition: No impairment Naming: No impairment Pragmatics: No impairment Written Expression Dominant Hand: Right Written Expression: Not tested   Oral / Motor  Oral Motor/Sensory Function Overall Oral Motor/Sensory Function: Within functional limits Motor Speech Overall Motor Speech: Appears within functional limits for tasks assessed Respiration: Within functional limits Phonation: Normal Resonance: Within functional limits Articulation: Within functional limitis Intelligibility: Intelligible Motor Planning: Witnin functional limits Motor Speech Errors: Not applicable           Clyde Canterbury, M.S., CCC-SLP Speech-Language Pathologist [facility] [phone] (ASCOM)   Woodroe Chen 12/09/2021, 11:42 AM

## 2021-12-09 NOTE — Consult Note (Signed)
Urology Consult   I have been asked to see the patient by Dr. Georgeann Oppenheim, for evaluation and management of penile bleeding/hematuria.  Chief Complaint: Weakness, penile bleeding  HPI:  Carlos Frey is a 88 y.o. year old male who presented to the ER yesterday with some difficulty speaking and concern for possible stroke.  He was evaluated by neurology this was felt to possibly resent a TIA, and he was loaded with a 300 mg dose of Plavix.  After this he developed some penile bleeding at the initiation and termination of urination, but voided urine was yellow.  He denies any urinary discomfort aside from some very mild dysuria that has since resolved.  He denies any flank or abdominal pain.  No prior history of gross hematuria or urinary symptoms at baseline.  Bleeding has since resolved and he is voiding clear yellow urine this morning.  Urinalysis last night notable for greater than 50 RBCs but otherwise benign.  No prior abdominal imaging to review.  He denies any prior urologic problems.  He has a 5-pack-year smoking history and quit over 50 years ago, previously worked for an [employer].  PMH: Past Medical History:  Diagnosis Date   Barrett esophagus    GERD (gastroesophageal reflux disease)    High cholesterol    Hypertension     Surgical History: Past Surgical History:  Procedure Laterality Date   APPENDECTOMY     COLONOSCOPY  08/08/03, 12/05/08 & 12/14/13   ESOPHAGOGASTRODUODENOSCOPY  10/09/09, 12/25/09 & 03/24/12   ESOPHAGOGASTRODUODENOSCOPY (EGD) WITH PROPOFOL N/A 07/17/2017   Procedure: ESOPHAGOGASTRODUODENOSCOPY (EGD) WITH PROPOFOL;  Surgeon: Christena Deem, MD;  Location: [facility] ENDOSCOPY;  Service: Endoscopy;  Laterality: N/A;   TONSILLECTOMY       Allergies: No Known Allergies   Social History:  reports that he quit smoking about 58 years ago. His smoking use included pipe and cigars. He has never used smokeless tobacco. He reports that he does not drink  alcohol and does not use drugs.  ROS: Negative aside from those stated in the HPI.  Physical Exam: BP (!) 178/67   Pulse (!) 58   Temp 97.9 F (36.6 C) (Oral)   Resp 15   SpO2 97%    Constitutional:  Alert and oriented, No acute distress. Cardiovascular: No clubbing, cyanosis, or edema. Respiratory: Normal respiratory effort, no increased work of breathing. GI: Abdomen is soft, nontender, nondistended, no abdominal masses GU: Mild hypospadias, no penile lesions, no blood at the meatus, testicles 20 cc and descended bilaterally Lymph: No cervical or inguinal lymphadenopathy. Skin: No rashes, bruises or suspicious lesions. Neurologic: Grossly intact, no focal deficits, moving all 4 extremities. Psychiatric: Normal mood and affect.   Laboratory Data: Reviewed in epic, see HPI  Pertinent Imaging: No abdominal cross-sectional imaging to review  Assessment & Plan:   88 year old male with no prior urologic history who presents with a possible TIA and developed penile bleeding at the initiation and termination of urination after a loading dose of 300 mg Plavix.  Hematuria since resolved, and he is voiding clear yellow urine without issue.  We discussed common possible etiologies of hematuria including BPH, malignancy, urolithiasis, medical renal disease, and idiopathic. Standard workup recommended by the AUA includes imaging with CT urogram to assess the upper tracts, and cystoscopy. Cytology is performed on patient's with gross hematuria to look for malignant cells in the urine.  Recommendations: -If admitted can perform CT urogram(CT hematuria) today, but if discharging is reasonable to  have this performed as an outpatient since hematuria has resolved and he feels well with no complaints.  Anticipate cystoscopy as outpatient as well pending CT findings -Can resume Plavix, as benefits likely outweigh the risks, and hematuria has since resolved  Sondra Come, MD  Total time spent  on the floor was 60 minutes, with greater than 50% spent in counseling and coordination of care with the patient regarding hematuria, possible etiologies, need for further evaluation with CT urogram and cystoscopy.  [facility] [address], [address] [place], [place] [address] [phone]

## 2021-12-09 NOTE — Assessment & Plan Note (Addendum)
-   This is manifested by expressive dysphasia. - The patient be admitted to a medical telemetry observation bed. - We will obtain a 2D echo with bubble study in AM. - We will follow neurochecks every 4 hours for 24 hours. - ST consult will be obtained. - We will hold off aspirin and Plavix as the patient symptoms have resolved and he continues to have active penile bleeding. - He had a neurology consult by Dr. Amada Jupiter.

## 2021-12-09 NOTE — Assessment & Plan Note (Signed)
-   We will continue statin therapy. 

## 2021-12-09 NOTE — ED Notes (Signed)
OT at bedside. 

## 2021-12-09 NOTE — Assessment & Plan Note (Signed)
-   We will continue PPI therapy 

## 2021-12-09 NOTE — Assessment & Plan Note (Signed)
-   The patient will be evaluated by Dr. Victorio Palm in AM. - We will hold off aspirin and Plavix at this time pending urology evaluation.  He had his doses on 7/1. - We will follow serial H&H. - A urinalysis is currently pending especially given his dysuria.

## 2021-12-10 DIAGNOSIS — G459 Transient cerebral ischemic attack, unspecified: Secondary | ICD-10-CM | POA: Diagnosis not present

## 2021-12-10 LAB — URINE CULTURE: Culture: <10000 — AB

## 2021-12-10 MED ORDER — ASPIRIN EC 81 MG PO TBEC: 81.0000 mg | DELAYED_RELEASE_TABLET | Freq: Every day | ORAL | 11 refills | Status: DC

## 2021-12-10 NOTE — Discharge Summary (Signed)
Physician Discharge Summary  Carlos Frey TIR:[id number] DOB: 03/13/1930 DOA: 12/08/2021  PCP: [name], MD  Admit date: 12/08/2021 Discharge date: 12/10/2021  Admitted From: Home Disposition: Home  Recommendations for Outpatient Follow-up:  Follow up with PCP in 1-2 weeks Follow-up outpatient neurology 1 to 2 weeks Follow-up outpatient urology 1 week  Home Health: No Equipment/Devices: None  Discharge Condition: Stable CODE STATUS: Full Diet recommendation: Regular  Brief/Interim Summary:  14M seen for TIA who developed acute hematuria after DAPT was initiated.  Urology consulted.  Initially urine running clear, however after DAPT restarted, hematuria recurred.  Working diagnosis of TIA.  Discussed with neurology.  Per neurology the presentation of patient's symptoms is inconsistent with the findings of intracranial atherosclerosis, thus treated with recommendations for DAPT x3 weeks followed by aspirin monotherapy indefinitely.  Unfortunately when we initiated Plavix patient developed hematuria.  2 attempts were made at Plavix initiation and both times patient developed hematuria.  Urology engaged, recommended CT urogram.  CT looked reassuring.  Neurology reengaged.  At time of discharge will recommend aspirin monotherapy 81 mg daily.  Hold Plavix.  Patient will follow-up with urology in office in 1 week for cystoscopy.  At that time can resume discussion regarding risk benefit of DAPT.    Discharge Diagnoses:  Principal Problem:   TIA (transient ischemic attack) Active Problems:   Penile bleeding   Essential hypertension   Dyslipidemia   GERD (gastroesophageal reflux disease)  TIA Manifested by expressive aphasia.  2D echocardiogram reassuring.  Neurochecks reassuring.  MRI imaging survey reassuring.  No LVO or acute CVA.  Initially attempted DAPT aspirin and Plavix however patient developed acute hematuria.  2 attempts were made.  At this time neurology and urology  engaged.  Recommend aspirin monotherapy 81 mg daily.  Follow-up outpatient urology clinic 1 week for cystoscopy.  At that time resume discussion regarding risk benefit of dual antiplatelet therapy.  Hematuria See above.  At time of discharge will recommend aspirin monotherapy.  Hematuria clearing up at time of DC.  Discharge Instructions  Discharge Instructions     Diet - low sodium heart healthy   Complete by: As directed    Increase activity slowly   Complete by: As directed       Allergies as of 12/10/2021   No Known Allergies      Medication List     STOP taking these medications    lisinopril-hydrochlorothiazide 10-12.5 MG tablet Commonly known as: Zestoretic       TAKE these medications    amLODipine 5 MG tablet Commonly known as: NORVASC Take 5 mg by mouth daily.   aspirin EC 81 MG tablet Take 1 tablet (81 mg total) by mouth daily.   atorvastatin 40 MG tablet Commonly known as: Lipitor Take 1 tablet (40 mg total) by mouth daily.   cetirizine 10 MG tablet Commonly known as: ZYRTEC Take 10 mg by mouth daily.   lisinopril 20 MG tablet Commonly known as: ZESTRIL Take 20 mg by mouth daily.   omeprazole 20 MG capsule Commonly known as: PRILOSEC Take 20 mg by mouth daily.        Follow-up Information     Sondra Come, MD. Schedule an appointment as soon as possible for a visit in 1 week(s).   Specialty: Urology Contact information: [address] [place] [address] [phone]         Lonell Face, MD. Schedule an appointment as soon as possible for a visit in 2  day(s).   Specialty: [facility]rology [facility]  Any physician ok to see.  Follow up 2-3 weeks Contact information: [address] [facility] [place] [id number] [phone]                No Known Allergies  Consultations: Neurology Urology   Procedures/Studies: CT HEMATURIA WORKUP  Result Date:  12/09/2021 CLINICAL DATA:  Hematuria.  On Plavix. EXAM: CT ABDOMEN AND PELVIS WITHOUT AND WITH CONTRAST TECHNIQUE: Multidetector CT imaging of the abdomen and pelvis was performed following the standard protocol before and following the bolus administration of intravenous contrast. RADIATION DOSE REDUCTION: This exam was performed according to the departmental dose-optimization program which includes automated exposure control, adjustment of the mA and/or kV according to patient size and/or use of iterative reconstruction technique. CONTRAST:  100mL OMNIPAQUE IOHEXOL 300 MG/ML  SOLN COMPARISON:  None Available. FINDINGS: Lower chest: Evaluation of the lung bases limited by breathing artifact. Underlying emphysematous changes are noted. There is no pleural or pericardial effusion. Extensive aortic and coronary artery atherosclerosis with calcifications of the aortic valve. Hepatobiliary: The liver is normal in density without suspicious focal abnormality. No evidence of gallstones, gallbladder wall thickening or biliary dilatation. Pancreas: Unremarkable. No pancreatic ductal dilatation or surrounding inflammatory changes. Spleen: Normal in size without focal abnormality. Adrenals/Urinary Tract: Both adrenal glands appear normal. Pre contrast images demonstrate punctate nonobstructing calculi in the interpolar and lower pole regions of the left kidney. No evidence of right renal, ureteral or bladder calculi. Post-contrast, both kidneys enhance normally. There is no evidence of enhancing renal mass. There are small left renal cysts, measuring up to 1.7 cm anteriorly in the lower pole of the left kidney (image 51/7); no follow-up imaging recommended. Delayed images result in segmental visualization of the ureters. No focal upper tract urothelial abnormalities are identified. The bladder appears unremarkable. The bladder appears unremarkable for its degree of distention. Stomach/Bowel: No enteric contrast administered.  The stomach appears unremarkable for its degree of distension. No evidence of bowel wall thickening, distention or surrounding inflammatory change. The appendix appears normal. Diverticular changes throughout the colon. Vascular/Lymphatic: There are no enlarged abdominal or pelvic lymph nodes. Aortic and branch vessel atherosclerosis without evidence of aneurysm or large vessel occlusion. Reproductive: Mild heterogeneous enlargement of the prostate gland. Other: Mildly prominent fat in both inguinal canals, left greater than right. No ascites or free air. Musculoskeletal: No acute or significant osseous findings. Multilevel lumbar spondylosis. Grade 1 anterolisthesis at L5-S1 with resulting mild biforaminal narrowing due to underlying bilateral L5 pars defects. Deformity of the left femoral head with bilateral hip degenerative changes. There are probable loose bodies in the left hip. IMPRESSION: 1. Punctate nonobstructing left renal calculi. No evidence of ureteral calculus or hydronephrosis. 2. No evidence of enhancing renal mass or focal urothelial lesion. 3. Prostatomegaly. 4. Diffuse colonic diverticulosis without evidence of acute inflammation. 5. Coronary and aortic atherosclerosis (ICD10-I70.0). Emphysema (ICD10-J43.9). Electronically Signed   By: Carey BullocksWilliam  Veazey M.D.   On: 12/09/2021 15:48   MR BRAIN WO CONTRAST  Result Date: 12/08/2021 CLINICAL DATA:  Initial evaluation for neuro deficit, stroke suspected. EXAM: MRI HEAD WITHOUT CONTRAST MRA HEAD WITHOUT CONTRAST MRA NECK WITHOUT CONTRAST TECHNIQUE: Multiplanar, multiecho pulse sequences of the brain and surrounding structures were obtained without intravenous contrast. Angiographic images of the Circle of Willis were obtained using MRA technique without intravenous contrast. Angiographic images of the neck were obtained using MRA technique without intravenous contrast. Carotid stenosis measurements (when applicable) are  obtained utilizing NASCET  criteria, using the distal internal carotid diameter as the denominator. COMPARISON:  Prior CT from earlier the same day as well as recent exam from 11/14/2021. FINDINGS: MRI HEAD FINDINGS Brain: Generalized age-related cerebral atrophy. Patchy and confluent T2/FLAIR hyperintensity involving the periventricular deep white matter both cerebral hemispheres, most consistent with chronic small vessel ischemic disease, mild to moderate for age. Mild patchy involvement of the pons. Few scattered remote lacunar infarcts present about the bilateral basal ganglia and left pons. Associated chronic hemosiderin staining noted about a few of these infarcts. Appearance is stable from recent MRI. No evidence for acute or subacute infarct. Gray-white matter differentiation maintained. No areas of chronic cortical infarction. No acute intracranial hemorrhage. No mass lesion, midline shift or mass effect no hydrocephalus or extra-axial fluid collection. Pituitary gland suprasellar region normal. Midline structures intact and normally formed. Vascular: Major intracranial vascular flow voids are maintained. Asymmetric FLAIR signal intensity within the left transverse sinus without T1 correlate, consistent with slow/sluggish flow, similar to prior. Skull and upper cervical spine: Craniocervical junction within normal limits. Bone marrow signal intensity normal. No scalp soft tissue abnormality. Sinuses/Orbits: Globes and orbital soft tissues within normal limits. Paranasal sinuses are largely clear. No mastoid effusion. Other: None. MRA HEAD FINDINGS ANTERIOR CIRCULATION: Visualized distal cervical segments of the internal carotid arteries are patent with antegrade flow. Petrous segments patent bilaterally. Mild atheromatous irregularity within the carotid siphons without significant stenosis. A1 segments patent bilaterally. Normal anterior communicating artery complex. Anterior cerebral arteries widely patent. No M1 stenosis or  occlusion. Normal MCA bifurcations. No proximal MCA branch occlusion. Distal MCA branches perfused and symmetric. POSTERIOR CIRCULATION: Both vertebral arteries widely patent to the vertebrobasilar junction. Right vertebral artery dominant. Left PICA patent proximally. Right PICA not well seen. Basilar patent to its distal aspect without stenosis. Superior cerebral arteries patent bilaterally. Both PCAs primarily supplied via the basilar. Left PCA widely patent to its distal aspect. Focal severe right P2 stenosis noted, stable (series 5, image 128). Right PCA mildly attenuated but patent distally. No intracranial aneurysm. MRA NECK FINDINGS AORTIC ARCH: Examination degraded by motion. Visualized aortic arch normal caliber with standard 3 vessel morphology. No stenosis about the origin of the great vessels. RIGHT CAROTID SYSTEM: Right common and internal carotid arteries are mildly tortuous. Mild-to-moderate atheromatous change about the right carotid bulb without hemodynamically significant stenosis. No dissection or other acute finding. LEFT CAROTID SYSTEM: Left common and internal carotid arteries are mildly tortuous. Mild-to-moderate atheromatous change about the left carotid bulb/proximal left ICA without hemodynamically significant stenosis. No dissection or other acute finding. VERTEBRAL ARTERIES: Both vertebral arteries arise from subclavian arteries. No visible proximal subclavian artery stenosis. Right vertebral artery slightly dominant. Visualized vertebral arteries patent without stenosis or evidence for dissection. IMPRESSION: MRI HEAD: 1. No acute intracranial infarct or other abnormality. 2. Age-related cerebral atrophy with chronic small vessel ischemic disease, with a few scattered remote lacunar infarcts involving the bilateral basal ganglia and left pons. Overall, appearance is stable as compared to recent MRI from 11/14/2021. MRA HEAD: 1. Stable intracranial MRA. No large vessel occlusion or other  emergent finding. 2. Focal severe right P2 stenosis, stable. 3. No other hemodynamically significant or correctable stenosis within the intracranial circulation. MRA NECK: 1. Mild to moderate atheromatous change about the carotid bifurcations without hemodynamically significant greater than 50% stenosis. 2. Wide patency of both vertebral arteries within the neck. Right vertebral artery slightly dominant. Electronically Signed   By: Janell Quiet.D.  On: 12/08/2021 21:05   MR ANGIO HEAD WO CONTRAST  Result Date: 12/08/2021 CLINICAL DATA:  Initial evaluation for neuro deficit, stroke suspected. EXAM: MRI HEAD WITHOUT CONTRAST MRA HEAD WITHOUT CONTRAST MRA NECK WITHOUT CONTRAST TECHNIQUE: Multiplanar, multiecho pulse sequences of the brain and surrounding structures were obtained without intravenous contrast. Angiographic images of the Circle of Willis were obtained using MRA technique without intravenous contrast. Angiographic images of the neck were obtained using MRA technique without intravenous contrast. Carotid stenosis measurements (when applicable) are obtained utilizing NASCET criteria, using the distal internal carotid diameter as the denominator. COMPARISON:  Prior CT from earlier the same day as well as recent exam from 11/14/2021. FINDINGS: MRI HEAD FINDINGS Brain: Generalized age-related cerebral atrophy. Patchy and confluent T2/FLAIR hyperintensity involving the periventricular deep white matter both cerebral hemispheres, most consistent with chronic small vessel ischemic disease, mild to moderate for age. Mild patchy involvement of the pons. Few scattered remote lacunar infarcts present about the bilateral basal ganglia and left pons. Associated chronic hemosiderin staining noted about a few of these infarcts. Appearance is stable from recent MRI. No evidence for acute or subacute infarct. Gray-white matter differentiation maintained. No areas of chronic cortical infarction. No acute  intracranial hemorrhage. No mass lesion, midline shift or mass effect no hydrocephalus or extra-axial fluid collection. Pituitary gland suprasellar region normal. Midline structures intact and normally formed. Vascular: Major intracranial vascular flow voids are maintained. Asymmetric FLAIR signal intensity within the left transverse sinus without T1 correlate, consistent with slow/sluggish flow, similar to prior. Skull and upper cervical spine: Craniocervical junction within normal limits. Bone marrow signal intensity normal. No scalp soft tissue abnormality. Sinuses/Orbits: Globes and orbital soft tissues within normal limits. Paranasal sinuses are largely clear. No mastoid effusion. Other: None. MRA HEAD FINDINGS ANTERIOR CIRCULATION: Visualized distal cervical segments of the internal carotid arteries are patent with antegrade flow. Petrous segments patent bilaterally. Mild atheromatous irregularity within the carotid siphons without significant stenosis. A1 segments patent bilaterally. Normal anterior communicating artery complex. Anterior cerebral arteries widely patent. No M1 stenosis or occlusion. Normal MCA bifurcations. No proximal MCA branch occlusion. Distal MCA branches perfused and symmetric. POSTERIOR CIRCULATION: Both vertebral arteries widely patent to the vertebrobasilar junction. Right vertebral artery dominant. Left PICA patent proximally. Right PICA not well seen. Basilar patent to its distal aspect without stenosis. Superior cerebral arteries patent bilaterally. Both PCAs primarily supplied via the basilar. Left PCA widely patent to its distal aspect. Focal severe right P2 stenosis noted, stable (series 5, image 128). Right PCA mildly attenuated but patent distally. No intracranial aneurysm. MRA NECK FINDINGS AORTIC ARCH: Examination degraded by motion. Visualized aortic arch normal caliber with standard 3 vessel morphology. No stenosis about the origin of the great vessels. RIGHT CAROTID  SYSTEM: Right common and internal carotid arteries are mildly tortuous. Mild-to-moderate atheromatous change about the right carotid bulb without hemodynamically significant stenosis. No dissection or other acute finding. LEFT CAROTID SYSTEM: Left common and internal carotid arteries are mildly tortuous. Mild-to-moderate atheromatous change about the left carotid bulb/proximal left ICA without hemodynamically significant stenosis. No dissection or other acute finding. VERTEBRAL ARTERIES: Both vertebral arteries arise from subclavian arteries. No visible proximal subclavian artery stenosis. Right vertebral artery slightly dominant. Visualized vertebral arteries patent without stenosis or evidence for dissection. IMPRESSION: MRI HEAD: 1. No acute intracranial infarct or other abnormality. 2. Age-related cerebral atrophy with chronic small vessel ischemic disease, with a few scattered remote lacunar infarcts involving the bilateral basal ganglia and left pons. Overall, appearance  is stable as compared to recent MRI from 11/14/2021. MRA HEAD: 1. Stable intracranial MRA. No large vessel occlusion or other emergent finding. 2. Focal severe right P2 stenosis, stable. 3. No other hemodynamically significant or correctable stenosis within the intracranial circulation. MRA NECK: 1. Mild to moderate atheromatous change about the carotid bifurcations without hemodynamically significant greater than 50% stenosis. 2. Wide patency of both vertebral arteries within the neck. Right vertebral artery slightly dominant. Electronically Signed   By: Rise Mu M.D.   On: 12/08/2021 21:05   MR ANGIO NECK WO CONTRAST  Result Date: 12/08/2021 CLINICAL DATA:  Initial evaluation for neuro deficit, stroke suspected. EXAM: MRI HEAD WITHOUT CONTRAST MRA HEAD WITHOUT CONTRAST MRA NECK WITHOUT CONTRAST TECHNIQUE: Multiplanar, multiecho pulse sequences of the brain and surrounding structures were obtained without intravenous contrast.  Angiographic images of the Circle of Willis were obtained using MRA technique without intravenous contrast. Angiographic images of the neck were obtained using MRA technique without intravenous contrast. Carotid stenosis measurements (when applicable) are obtained utilizing NASCET criteria, using the distal internal carotid diameter as the denominator. COMPARISON:  Prior CT from earlier the same day as well as recent exam from 11/14/2021. FINDINGS: MRI HEAD FINDINGS Brain: Generalized age-related cerebral atrophy. Patchy and confluent T2/FLAIR hyperintensity involving the periventricular deep white matter both cerebral hemispheres, most consistent with chronic small vessel ischemic disease, mild to moderate for age. Mild patchy involvement of the pons. Few scattered remote lacunar infarcts present about the bilateral basal ganglia and left pons. Associated chronic hemosiderin staining noted about a few of these infarcts. Appearance is stable from recent MRI. No evidence for acute or subacute infarct. Gray-white matter differentiation maintained. No areas of chronic cortical infarction. No acute intracranial hemorrhage. No mass lesion, midline shift or mass effect no hydrocephalus or extra-axial fluid collection. Pituitary gland suprasellar region normal. Midline structures intact and normally formed. Vascular: Major intracranial vascular flow voids are maintained. Asymmetric FLAIR signal intensity within the left transverse sinus without T1 correlate, consistent with slow/sluggish flow, similar to prior. Skull and upper cervical spine: Craniocervical junction within normal limits. Bone marrow signal intensity normal. No scalp soft tissue abnormality. Sinuses/Orbits: Globes and orbital soft tissues within normal limits. Paranasal sinuses are largely clear. No mastoid effusion. Other: None. MRA HEAD FINDINGS ANTERIOR CIRCULATION: Visualized distal cervical segments of the internal carotid arteries are patent with  antegrade flow. Petrous segments patent bilaterally. Mild atheromatous irregularity within the carotid siphons without significant stenosis. A1 segments patent bilaterally. Normal anterior communicating artery complex. Anterior cerebral arteries widely patent. No M1 stenosis or occlusion. Normal MCA bifurcations. No proximal MCA branch occlusion. Distal MCA branches perfused and symmetric. POSTERIOR CIRCULATION: Both vertebral arteries widely patent to the vertebrobasilar junction. Right vertebral artery dominant. Left PICA patent proximally. Right PICA not well seen. Basilar patent to its distal aspect without stenosis. Superior cerebral arteries patent bilaterally. Both PCAs primarily supplied via the basilar. Left PCA widely patent to its distal aspect. Focal severe right P2 stenosis noted, stable (series 5, image 128). Right PCA mildly attenuated but patent distally. No intracranial aneurysm. MRA NECK FINDINGS AORTIC ARCH: Examination degraded by motion. Visualized aortic arch normal caliber with standard 3 vessel morphology. No stenosis about the origin of the great vessels. RIGHT CAROTID SYSTEM: Right common and internal carotid arteries are mildly tortuous. Mild-to-moderate atheromatous change about the right carotid bulb without hemodynamically significant stenosis. No dissection or other acute finding. LEFT CAROTID SYSTEM: Left common and internal carotid arteries are mildly tortuous. Mild-to-moderate  atheromatous change about the left carotid bulb/proximal left ICA without hemodynamically significant stenosis. No dissection or other acute finding. VERTEBRAL ARTERIES: Both vertebral arteries arise from subclavian arteries. No visible proximal subclavian artery stenosis. Right vertebral artery slightly dominant. Visualized vertebral arteries patent without stenosis or evidence for dissection. IMPRESSION: MRI HEAD: 1. No acute intracranial infarct or other abnormality. 2. Age-related cerebral atrophy with  chronic small vessel ischemic disease, with a few scattered remote lacunar infarcts involving the bilateral basal ganglia and left pons. Overall, appearance is stable as compared to recent MRI from 11/14/2021. MRA HEAD: 1. Stable intracranial MRA. No large vessel occlusion or other emergent finding. 2. Focal severe right P2 stenosis, stable. 3. No other hemodynamically significant or correctable stenosis within the intracranial circulation. MRA NECK: 1. Mild to moderate atheromatous change about the carotid bifurcations without hemodynamically significant greater than 50% stenosis. 2. Wide patency of both vertebral arteries within the neck. Right vertebral artery slightly dominant. Electronically Signed   By: Rise Mu M.D.   On: 12/08/2021 21:05   CT HEAD CODE STROKE WO CONTRAST  Result Date: 12/08/2021 CLINICAL DATA:  Code stroke. Neuro deficit, acute, stroke suspected. Weakness. Abnormal speech. Patient has had similar symptoms intermittently. Symptoms began 1.5 hours ago. EXAM: CT HEAD WITHOUT CONTRAST TECHNIQUE: Contiguous axial images were obtained from the base of the skull through the vertex without intravenous contrast. RADIATION DOSE REDUCTION: This exam was performed according to the departmental dose-optimization program which includes automated exposure control, adjustment of the mA and/or kV according to patient size and/or use of iterative reconstruction technique. COMPARISON:  CT head and MR head 11/14/2021 FINDINGS: Brain: Moderate atrophy and white matter changes are stable. Remote lacunar infarcts are present in the left external capsule. No acute infarct, hemorrhage, or mass lesion is present. The ventricles are of normal size. No significant extraaxial fluid collection is present. Vascular: Atherosclerotic calcifications are present within the cavernous internal carotid arteries bilaterally. No hyperdense vessel is present. Skull: Calvarium is intact. No focal lytic or blastic  lesions are present. No significant extracranial soft tissue lesion is present. Sinuses/Orbits: The paranasal sinuses and mastoid air cells are clear. The globes and orbits are within normal limits. ASPECTS [facility] Stroke Program Early CT Score) - Ganglionic level infarction (caudate, lentiform nuclei, internal capsule, insula, M1-M3 cortex): 7/7 - Supraganglionic infarction (M4-M6 cortex): 3/3 Total score (0-10 with 10 being normal): 10/10 IMPRESSION: 1. No acute intracranial abnormality or significant interval change. 2. Stable atrophy and white matter disease. This likely reflects the sequela of chronic microvascular ischemia. *ASPECTS is 10/10 * * The above was relayed via text pager to Dr. Amada Jupiter on 12/08/2021 at 18:45 . Electronically Signed   By: Marin Roberts M.D.   On: 12/08/2021 18:45   ECHOCARDIOGRAM COMPLETE BUBBLE STUDY  Result Date: 11/15/2021    ECHOCARDIOGRAM REPORT   Patient Name:   FAHAD CISSE Date of Exam: 11/15/2021 Medical Rec #:  [id number]         Height:       71.0 in Accession #:    [id number]        Weight:       179.0 lb Date of Birth:  11/11/1929        BSA:          2.011 m Patient Age:    88 years          BP:           121/53 mmHg Patient Gender: M  HR:           67 bpm. Exam Location:  [facility] Procedure: 2D Echo, Color Doppler, Cardiac Doppler and Saline Contrast Bubble            Study Indications:     I63.9 Stroke  History:         Patient has prior history of Echocardiogram examinations. Risk                  Factors:Hypertension and HCL.  Sonographer:     Humphrey Rolls Referring Phys:  [id number] Eliezer Mccoy PATEL Diagnosing Phys: Julien Nordmann MD  Sonographer Comments: Image acquisition challenging due to respiratory motion. IMPRESSIONS  1. Left ventricular ejection fraction, by estimation, is 55 to 60%. The left ventricle has normal function. The left ventricle has no regional wall motion abnormalities. Left ventricular diastolic parameters are consistent with  Grade I diastolic dysfunction (impaired relaxation).  2. Right ventricular systolic function is normal. The right ventricular size is normal.  3. Left atrial size was mildly dilated.  4. The mitral valve is normal in structure. No evidence of mitral valve regurgitation. No evidence of mitral stenosis.  5. The aortic valve is normal in structure. There is severe calcifcation of the aortic valve. Aortic valve regurgitation is not visualized. Moderate aortic valve stenosis. Aortic valve area, by VTI measures 0.96 cm. Aortic valve mean gradient measures 21.0 mmHg. Aortic valve Vmax measures 2.88 m/s.  6. The inferior vena cava is normal in size with greater than 50% respiratory variability, suggesting right atrial pressure of 3 mmHg.  7. Agitated saline contrast bubble study was negative, with no evidence of any interatrial shunt. FINDINGS  Left Ventricle: Left ventricular ejection fraction, by estimation, is 55 to 60%. The left ventricle has normal function. The left ventricle has no regional wall motion abnormalities. The left ventricular internal cavity size was normal in size. There is  no left ventricular hypertrophy. Left ventricular diastolic parameters are consistent with Grade I diastolic dysfunction (impaired relaxation). Right Ventricle: The right ventricular size is normal. No increase in right ventricular wall thickness. Right ventricular systolic function is normal. Left Atrium: Left atrial size was mildly dilated. Right Atrium: Right atrial size was normal in size. Pericardium: There is no evidence of pericardial effusion. Mitral Valve: The mitral valve is normal in structure. No evidence of mitral valve regurgitation. No evidence of mitral valve stenosis. MV peak gradient, 2.9 mmHg. The mean mitral valve gradient is 2.0 mmHg. Tricuspid Valve: The tricuspid valve is normal in structure. Tricuspid valve regurgitation is mild . No evidence of tricuspid stenosis. Aortic Valve: The aortic valve is normal in  structure. There is severe calcifcation of the aortic valve. Aortic valve regurgitation is not visualized. Moderate aortic stenosis is present. Aortic valve mean gradient measures 21.0 mmHg. Aortic valve peak gradient measures 33.2 mmHg. Aortic valve area, by VTI measures 0.96 cm. Pulmonic Valve: The pulmonic valve was normal in structure. Pulmonic valve regurgitation is not visualized. No evidence of pulmonic stenosis. Aorta: The aortic root is normal in size and structure. Venous: The inferior vena cava is normal in size with greater than 50% respiratory variability, suggesting right atrial pressure of 3 mmHg. IAS/Shunts: No atrial level shunt detected by color flow Doppler. Agitated saline contrast was given intravenously to evaluate for intracardiac shunting. Agitated saline contrast bubble study was negative, with no evidence of any interatrial shunt.  LEFT VENTRICLE PLAX 2D LVIDd:         4.47 cm  Diastology LVIDs:         2.43 cm   LV e' medial:    7.72 cm/s LV PW:         1.04 cm   LV E/e' medial:  7.5 LV IVS:        1.12 cm   LV e' lateral:   11.00 cm/s LVOT diam:     2.00 cm   LV E/e' lateral: 5.3 LV SV:         48 LV SV Index:   24 LVOT Area:     3.14 cm  RIGHT VENTRICLE RV Basal diam:  4.27 cm RV S prime:     15.80 cm/s LEFT ATRIUM           Index        RIGHT ATRIUM           Index LA diam:      4.70 cm 2.34 cm/m   RA Area:     19.30 cm LA Vol (A4C): 62.5 ml 31.07 ml/m  RA Volume:   59.40 ml  29.53 ml/m  AORTIC VALVE                     PULMONIC VALVE AV Area (Vmax):    0.99 cm      PV Vmax:          1.55 m/s AV Area (Vmean):   1.12 cm      PV Vmean:         103.000 cm/s AV Area (VTI):     0.96 cm      PV VTI:           0.319 m AV Vmax:           288.00 cm/s   PV Peak grad:     9.6 mmHg AV Vmean:          167.667 cm/s  PV Mean grad:     5.0 mmHg AV VTI:            0.504 m       PR End Diast Vel: 3.51 msec AV Peak Grad:      33.2 mmHg AV Mean Grad:      21.0 mmHg LVOT Vmax:         90.70 cm/s  LVOT Vmean:        60.000 cm/s LVOT VTI:          0.154 m LVOT/AV VTI ratio: 0.31  AORTA Ao Root diam: 3.80 cm MITRAL VALVE MV Area (PHT): 3.99 cm    SHUNTS MV Area VTI:   2.23 cm    Systemic VTI:  0.15 m MV Peak grad:  2.9 mmHg    Systemic Diam: 2.00 cm MV Mean grad:  2.0 mmHg MV Vmax:       0.85 m/s MV Vmean:      59.5 cm/s MV Decel Time: 190 msec MV E velocity: 57.80 cm/s MV A velocity: 71.10 cm/s MV E/A ratio:  0.81 Julien Nordmann MD Electronically signed by Julien Nordmann MD Signature Date/Time: 11/15/2021/12:34:44 PM    Final    CT ANGIO HEAD NECK W WO CM  Result Date: 11/14/2021 CLINICAL DATA:  Altered mental status EXAM: CT ANGIOGRAPHY HEAD AND NECK TECHNIQUE: Multidetector CT imaging of the head and neck was performed using the standard protocol during bolus administration of intravenous contrast. Multiplanar CT image reconstructions and MIPs were obtained to evaluate the vascular anatomy. Carotid stenosis measurements (when  applicable) are obtained utilizing NASCET criteria, using the distal internal carotid diameter as the denominator. RADIATION DOSE REDUCTION: This exam was performed according to the departmental dose-optimization program which includes automated exposure control, adjustment of the mA and/or kV according to patient size and/or use of iterative reconstruction technique. CONTRAST:  52mL OMNIPAQUE IOHEXOL 350 MG/ML SOLN COMPARISON:  None Available. FINDINGS: CTA NECK FINDINGS SKELETON: There is no bony spinal canal stenosis. No lytic or blastic lesion. OTHER NECK: Normal pharynx, larynx and major salivary glands. No cervical lymphadenopathy. Unremarkable thyroid gland. UPPER CHEST: No pneumothorax or pleural effusion. No nodules or masses. AORTIC ARCH: There is calcific atherosclerosis of the aortic arch. There is no aneurysm, dissection or hemodynamically significant stenosis of the visualized portion of the aorta. Conventional 3 vessel aortic branching pattern. The visualized proximal  subclavian arteries are widely patent. RIGHT CAROTID SYSTEM: No dissection, occlusion or aneurysm. There is mixed density atherosclerosis extending into the proximal ICA, resulting in less than 50% stenosis. LEFT CAROTID SYSTEM: No dissection, occlusion or aneurysm. There is mixed density atherosclerosis extending into the proximal ICA, resulting in less than 50% stenosis. VERTEBRAL ARTERIES: Left dominant configuration. Both origins are clearly patent. There is no dissection, occlusion or flow-limiting stenosis to the skull base (V1-V3 segments). CTA HEAD FINDINGS POSTERIOR CIRCULATION: --Vertebral arteries: Normal V4 segments. --Inferior cerebellar arteries: Normal. --Basilar artery: Normal. --Superior cerebellar arteries: Normal. 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 Left MCA is normal. ANTERIOR CIRCULATION: --Intracranial internal carotid arteries: Atherosclerotic calcification of the internal carotid arteries at the skull base without hemodynamically significant stenosis. --Anterior cerebral arteries (ACA): Normal. Both A1 segments are present. Patent anterior communicating artery (a-comm). --Middle cerebral arteries (MCA): Normal. VENOUS SINUSES: As permitted by contrast timing, patent. ANATOMIC VARIANTS: None Review of the MIP images confirms the above findings. IMPRESSION: 1. 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 2. Bilateral carotid bifurcation atherosclerosis without hemodynamically significant stenosis. 3.  Aortic atherosclerosis (ICD10-I70.0). Electronically Signed   By: Deatra Robinson M.D.   On: 11/14/2021 20:00   MR Brain Wo Contrast (neuro protocol)  Result Date: 11/14/2021 CLINICAL DATA:  Neuro deficit, acute, stroke suspected intermittent slurred speech EXAM: MRI HEAD WITHOUT CONTRAST TECHNIQUE: Multiplanar, multiecho pulse sequences of the brain and surrounding structures were obtained without intravenous  contrast. COMPARISON:  None FINDINGS: Motion artifact is present. Brain: There is no acute infarction or intracranial hemorrhage. There is no intracranial mass, mass effect, or edema. There is no hydrocephalus or extra-axial fluid collection. Patchy and mildly confluent areas of T2 hyperintensity in the supratentorial and pontine white matter are nonspecific but may reflect mild to moderate chronic microvascular ischemic changes. There are chronic hemorrhagic infarcts of the left basal ganglia and subinsular white matter and left pons. Right corona radiata/posterior lentiform nucleus. Prominence of the ventricles and sulci reflects mild parenchymal volume loss. Vascular: Major vessel flow voids at the skull base are preserved. Skull and upper cervical spine: Normal marrow signal is preserved. Sinuses/Orbits: Paranasal sinuses are aerated. Orbits are unremarkable. Other: Sella is unremarkable.  Mastoid air cells are clear. IMPRESSION: No acute infarction, hemorrhage, or mass. 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 Electronically Signed   By: Guadlupe Spanish M.D.   On: 11/14/2021 18:10   CT Head Wo Contrast  Result Date: 11/14/2021 CLINICAL DATA:  Altered mental status for the last 3 days EXAM: CT HEAD WITHOUT CONTRAST TECHNIQUE: Contiguous axial images were obtained from the base  of the skull through the vertex without intravenous contrast. RADIATION DOSE REDUCTION: This exam was performed according to the departmental dose-optimization program which includes automated exposure control, adjustment of the mA and/or kV according to patient size and/or use of iterative reconstruction technique. COMPARISON:  05/04/2021 FINDINGS: Brain: The brainstem, cerebellum, cerebral peduncles, thalami, basal ganglia, basilar cisterns, and ventricular system appear within normal limits. Periventricular white matter and corona radiata hypodensities favor chronic ischemic microvascular white matter disease. No  intracranial hemorrhage, mass lesion, or acute CVA. Vascular: There is atherosclerotic calcification of the cavernous carotid arteries bilaterally. Atherosclerotic calcification of the vertebral arteries noted. Skull: Unremarkable Sinuses/Orbits: Unremarkable Other: Calcification along the transverse ligament of the atlas. IMPRESSION: 1. No acute intracranial findings. 2. Periventricular white matter and corona radiata hypodensities favor chronic ischemic microvascular white matter disease. 3. Atherosclerosis. Electronically Signed   By: Gaylyn Rong M.D.   On: 11/14/2021 11:52   DG Chest 2 View  Result Date: 11/14/2021 CLINICAL DATA:  Weakness.  Altered mental status for 3 days. EXAM: CHEST - 2 VIEW COMPARISON:  05/04/2021 FINDINGS: Atherosclerotic calcification of the aortic arch. Thoracic spondylosis. Linear subsegmental atelectasis or scarring along the left hemidiaphragm. No blunting of the costophrenic angles. Mild thoracic kyphosis appear stable. IMPRESSION: 1.  No acute cardiopulmonary disease is radiographically apparent. 2.  Aortic Atherosclerosis (ICD10-I70.0). 3. Minimal scarring or atelectasis along the left hemidiaphragm. 4. Thoracic spondylosis. Electronically Signed   By: Gaylyn Rong M.D.   On: 11/14/2021 11:44      Subjective: Seen and examined on the day of discharge.  Neurologically intact.  No aphasia noted.  Stable for discharge home.  Follow-up outpatient PCP, neurology, urology  Discharge Exam: Vitals:   12/10/21 0526 12/10/21 0833  BP: (!) 169/74 (!) 167/76  Pulse: (!) 55 (!) 52  Resp: 18 18  Temp: 97.8 F (36.6 C) (!) 97.5 F (36.4 C)  SpO2: 92% 94%   Vitals:   12/09/21 2139 12/10/21 0015 12/10/21 0526 12/10/21 0833  BP: (!) 151/71 (!) 159/64 (!) 169/74 (!) 167/76  Pulse: 66 64 (!) 55 (!) 52  Resp: 16 16 18 18   Temp: 98.5 F (36.9 C) 98 F (36.7 C) 97.8 F (36.6 C) (!) 97.5 F (36.4 C)  TempSrc:      SpO2: 96% 95% 92% 94%    General: Pt is  alert, awake, not in acute distress Cardiovascular: RRR, S1/S2 +, no rubs, no gallops Respiratory: CTA bilaterally, no wheezing, no rhonchi Abdominal: Soft, NT, ND, bowel sounds + Extremities: no edema, no cyanosis    The results of significant diagnostics from this hospitalization (including imaging, microbiology, ancillary and laboratory) are listed below for reference.     Microbiology: Recent Results (from the past 240 hour(s))  Urine Culture     Status: Abnormal   Collection Time: 12/09/21 12:50 AM   Specimen: Urine, Clean Catch  Result Value Ref Range Status   Specimen Description   Final    URINE, CLEAN CATCH Performed at [facility], [address] [place], [place]    Special Requests   Final    NONE Performed at [facility], [address], [place], [place]    Culture (A)  Final    <10,000 COLONIES/mL INSIGNIFICANT GROWTH Performed at [facility], [address], [place], [place]    Report Status 12/10/2021 FINAL  Final     Labs: BNP (last 3 results) No results for input(s): "BNP" in the last  8760 hours. Basic Metabolic Panel: Recent Labs  Lab 12/08/21 1845  NA 138  K 4.0  CL 105  CO2 26  GLUCOSE 116*  BUN 16  CREATININE 0.73  CALCIUM 9.3   Liver Function Tests: Recent Labs  Lab 12/08/21 1845  AST 21  ALT 15  ALKPHOS 68  BILITOT 1.2  PROT 7.2  ALBUMIN 4.2   No results for input(s): "LIPASE", "AMYLASE" in the last 168 hours. No results for input(s): "AMMONIA" in the last 168 hours. CBC: Recent Labs  Lab 12/08/21 1845  WBC 6.0  NEUTROABS 4.4  HGB 14.2  HCT 42.6  MCV 98.8  PLT 154   Cardiac Enzymes: No results for input(s): "CKTOTAL", "CKMB", "CKMBINDEX", "TROPONINI" in the last 168 hours. BNP: Invalid input(s): "POCBNP" CBG: Recent Labs  Lab 12/08/21 1826  GLUCAP 112*   D-Dimer No results for input(s): "DDIMER" in the last 72 hours. Hgb A1c No results for input(s):  "HGBA1C" in the last 72 hours. Lipid Profile Recent Labs    12/09/21 0537  CHOL 127  HDL 28*  LDLCALC 81  TRIG 90  CHOLHDL 4.5   Thyroid function studies No results for input(s): "TSH", "T4TOTAL", "T3FREE", "THYROIDAB" in the last 72 hours.  Invalid input(s): "FREET3" Anemia work up No results for input(s): "VITAMINB12", "FOLATE", "FERRITIN", "TIBC", "IRON", "RETICCTPCT" in the last 72 hours. Urinalysis    Component Value Date/Time   COLORURINE YELLOW (A) 12/09/2021 0050   APPEARANCEUR HAZY (A) 12/09/2021 0050   LABSPEC 1.006 12/09/2021 0050   PHURINE 9.0 (H) 12/09/2021 0050   GLUCOSEU NEGATIVE 12/09/2021 0050   HGBUR LARGE (A) 12/09/2021 0050   BILIRUBINUR NEGATIVE 12/09/2021 0050   KETONESUR NEGATIVE 12/09/2021 0050   PROTEINUR NEGATIVE 12/09/2021 0050   NITRITE NEGATIVE 12/09/2021 0050   LEUKOCYTESUR NEGATIVE 12/09/2021 0050   Sepsis Labs Recent Labs  Lab 12/08/21 1845  WBC 6.0   Microbiology Recent Results (from the past 240 hour(s))  Urine Culture     Status: Abnormal   Collection Time: 12/09/21 12:50 AM   Specimen: Urine, Clean Catch  Result Value Ref Range Status   Specimen Description   Final    URINE, CLEAN CATCH Performed at [facility], [address], [place], [place] [address]    Special Requests   Final    NONE Performed at [facility], [address] [place], [place] [address]    Culture (A)  Final    <10,000 COLONIES/mL INSIGNIFICANT GROWTH Performed at [facility], [address] [place], [place] [address]    Report Status 12/10/2021 FINAL  Final     Time coordinating discharge: Over 30 minutes  SIGNED:   Tresa Moore, MD  [facility] 12/10/2021, 10:44 AM Pager   If 7PM-7AM, please contact night-coverage

## 2021-12-10 NOTE — Progress Notes (Signed)
OT Cancellation Note  Patient Details Name: Carlos Frey MRN: [id number] DOB: Nov 30, 1929   Cancelled Treatment:    Reason Eval/Treat Not Completed: OT screened, no needs identified, will sign off. Consult received, chart reviewed. Pt screened, no skilled OT services identified. Pt mod indep with ADL transfers and LB/UB ADL. No difficulty with ADL noted. Will sign off.   Arman Filter., MPH, MS, OTR/L ascom [phone] 12/10/21, 9:07 AM

## 2021-12-13 ENCOUNTER — Telehealth: Payer: Self-pay

## 2021-12-13 NOTE — Telephone Encounter (Signed)
Appt scheduled

## 2021-12-13 NOTE — Telephone Encounter (Signed)
-----   Message from Sondra Come, MD sent at 12/13/2021  8:07 AM EDT ----- Regarding: cysto Please schedule cysto next week, thanks  Legrand Rams, MD 12/13/2021

## 2021-12-14 DIAGNOSIS — I350 Nonrheumatic aortic (valve) stenosis: Secondary | ICD-10-CM | POA: Insufficient documentation

## 2021-12-20 ENCOUNTER — Encounter: Payer: Self-pay | Admitting: Intensive Care

## 2021-12-20 ENCOUNTER — Other Ambulatory Visit: Payer: Self-pay

## 2021-12-20 ENCOUNTER — Emergency Department
Admission: EM | Admit: 2021-12-20 | Discharge: 2021-12-20 | Disposition: A | Discharge status: Home / Self Care | Payer: Medicare Other | Attending: Emergency Medicine | Admitting: Emergency Medicine

## 2021-12-20 ENCOUNTER — Emergency Department: Payer: Medicare Other

## 2021-12-20 DIAGNOSIS — R202 Paresthesia of skin: Secondary | ICD-10-CM | POA: Insufficient documentation

## 2021-12-20 DIAGNOSIS — R0789 Other chest pain: Secondary | ICD-10-CM | POA: Insufficient documentation

## 2021-12-20 DIAGNOSIS — R200 Anesthesia of skin: Secondary | ICD-10-CM | POA: Diagnosis present

## 2021-12-20 DIAGNOSIS — R079 Chest pain, unspecified: Secondary | ICD-10-CM

## 2021-12-20 DIAGNOSIS — I10 Essential (primary) hypertension: Secondary | ICD-10-CM | POA: Insufficient documentation

## 2021-12-20 LAB — CBC
HCT: 42 % (ref 39.0–52.0)
Hemoglobin: 14.1 g/dL (ref 13.0–17.0)
MCH: 32.7 pg (ref 26.0–34.0)
MCHC: 33.6 g/dL (ref 30.0–36.0)
MCV: 97.4 fL (ref 80.0–100.0)
Platelets: 177 10*3/uL (ref 150–400)
RBC: 4.31 MIL/uL (ref 4.22–5.81)
RDW: 12.8 % (ref 11.5–15.5)
WBC: 8.5 10*3/uL (ref 4.0–10.5)
nRBC: 0 % (ref 0.0–0.2)

## 2021-12-20 LAB — BASIC METABOLIC PANEL
Anion gap: 8 (ref 5–15)
BUN: 16 mg/dL (ref 8–23)
CO2: 24 mmol/L (ref 22–32)
Calcium: 9.3 mg/dL (ref 8.9–10.3)
Chloride: 103 mmol/L (ref 98–111)
Creatinine, Ser: 0.75 mg/dL (ref 0.61–1.24)
GFR, Estimated: >60 mL/min (ref >60)
Glucose, Bld: 120 mg/dL — ABNORMAL HIGH (ref 70–99)
Potassium: 4 mmol/L (ref 3.5–5.1)
Sodium: 135 mmol/L (ref 135–145)

## 2021-12-20 LAB — MAGNESIUM: Magnesium: 1.9 mg/dL (ref 1.7–2.4)

## 2021-12-20 LAB — TROPONIN I (HIGH SENSITIVITY)
Troponin I (High Sensitivity): 14 ng/L (ref <18)
Troponin I (High Sensitivity): 17 ng/L (ref <18)

## 2021-12-20 NOTE — ED Provider Notes (Signed)
[facility] Provider Note    Event Date/Time   First MD Initiated Contact with Patient 12/20/21 1526     (approximate)   History   Chest Pain   HPI  Carlos Frey is a 88 y.o. male with a past medical history of HTN, HDL, GERD and recent admission discharged 7/3 for evaluation of a TIA in setting history of patient developed after being started on DAPT therapy who presents for evaluation of some numbness in his face that occurred earlier today as well as some chest pain.  He states that this felt the numbness after coming in from knowing his lawn and seem to be symmetric on both side of the face.  Endorses chronic numbness in his fingers and denies any other new numbness in his upper or lower extremities.  States he had some left-sided chest discomfort today but this is not a different than the chest discomfort he has had the same spot on and off for weeks.  It is not exertional or positional.  He states he usually has in the morning and gets better when he starts moving.  He denies any new cough, shortness of breath, fevers, nausea, vomiting, diarrhea, rash or recent falls or injuries.  Denies any tobacco abuse or EtOH use.    Past Medical History:  Diagnosis Date   Barrett esophagus    GERD (gastroesophageal reflux disease)    High cholesterol    Hypertension      Physical Exam  Triage Vital Signs: ED Triage Vitals  Enc Vitals Group     BP 12/20/21 1503 129/64     Pulse Rate 12/20/21 1503 (!) 56     Resp 12/20/21 1503 16     Temp 12/20/21 1503 98.1 F (36.7 C)     Temp Source 12/20/21 1503 Oral     SpO2 12/20/21 1503 96 %     Weight 12/20/21 1504 180 lb (81.6 kg)     Height 12/20/21 1504 5\' 11"  (1.803 m)     Head Circumference --      Peak Flow --      Pain Score 12/20/21 1504 4     Pain Loc --      Pain Edu? --      Excl. in GC? --     Most recent vital signs: Vitals:   12/20/21 1749 12/20/21 1830  BP: (!) 163/88 (!) 158/57  Pulse:  64 60  Resp: 16 18  Temp:    SpO2: 98% 97%    General: Awake, no distress.  CV:  Good peripheral perfusion.  Resp:  Normal effort.  Abd:  No distention.  Other:     ED Results / Procedures / Treatments  Labs (all labs ordered are listed, but only abnormal results are displayed) Labs Reviewed  BASIC METABOLIC PANEL - Abnormal; Notable for the following components:      Result Value   Glucose, Bld 120 (*)    All other components within normal limits  CBC  MAGNESIUM  TROPONIN I (HIGH SENSITIVITY)  TROPONIN I (HIGH SENSITIVITY)     EKG  ECG is remarkable for sinus bradycardia with a ventricular rate of 56 and apparent first-degree Mobitz type I block with otherwise unremarkable intervals and nonspecific ST change in lead III without other clear evidence of acute ischemia.   RADIOLOGY  Chest reviewed by myself shows no focal consoidation, effusion, edema, pneumothorax or other clear acute thoracic process. I also reviewed radiology interpretation and  agree with findings described.  MR brain, interpretation without evidence of acute hemorrhage, acute ischemia or other acute process.  I reviewed radiologist rotation and agree to findings of stable atrophy and white matter disease as well as remote lacunar infarcts but no other acute process.  PROCEDURES:  Critical Care performed: No  .1-3 Lead EKG Interpretation  Performed by: Gilles Chiquito, MD Authorized by: Gilles Chiquito, MD     Interpretation: non-specific     ECG rate assessment: bradycardic     Rhythm: sinus rhythm     Conduction: abnormal     Abnormal conduction: 2nd degree AV block (Mobitz 1)     The patient is on the cardiac monitor to evaluate for evidence of arrhythmia and/or significant heart rate changes.   MEDICATIONS ORDERED IN ED: Medications - No data to display   IMPRESSION / MDM / ASSESSMENT AND PLAN / ED COURSE  I reviewed the triage vital signs and the nursing notes. Patient's  presentation is most consistent with acute presentation with potential threat to life or bodily function.                               Differential diagnosis includes, but is not limited to possible paresthesias related to a CVA, TIA, dehydration, metabolic derangements and possible anginal equivalent arrhythmia or anemia.  He has no objective or clear acute focal deficits to suggest a CVA.  EKG shows bradycardia with some nonspecific findings and type II first-degree block.  Nonelevated troponins are negative x2.  I discussed this EKG with on-call cardiologist Dr.   Azucena Cecil  Who reviewed EKG and felt there is no indication for other acute inventions at this time patient follow-up at clinic as this was not Suggestive of third-degree or secondary type II block.  In addition patient's chest pain which she describes as better with exertion does not sound ischemic.  Possibly related to MSK  Chest reviewed by myself shows no focal consoidation, effusion, edema, pneumothorax or other clear acute thoracic process. I also reviewed radiology interpretation and agree with findings described.  MR brain, interpretation without evidence of acute hemorrhage, acute ischemia or other acute process.  I reviewed radiologist rotation and agree to findings of stable atrophy and white matter disease as well as remote lacunar infarcts but no other acute process.  Given he is reporting much better now with no ongoing decree sensation on both sides of his face with otherwise reassuring exam work-up I think at this point he is stable for discharge with outpatient follow-up.  Discharged in stable condition for strict return precautions advised and discussed      FINAL CLINICAL IMPRESSION(S) / ED DIAGNOSES   Final diagnoses:  Chest pain, unspecified type  Paresthesia     Rx / DC Orders   ED Discharge Orders     None        Note:  This document was prepared using Dragon voice recognition software  and may include unintentional dictation errors.   Gilles Chiquito, MD 12/20/21 [phone]

## 2021-12-20 NOTE — ED Triage Notes (Signed)
Patient c/o ongoing chest pain for weeks. Reports it feels achy. Patient previously was on blood thinners but was taken off of them due to it causing bleeding from penis. Recently admitted and discharged 12/08/21 for chest pain. Denies following up with cardiologist. A&O x4 during triage

## 2021-12-20 NOTE — ED Notes (Addendum)
Family at bedside. 

## 2021-12-20 NOTE — ED Notes (Signed)
Primary RN Arlyss Repress, rn

## 2021-12-20 NOTE — ED Notes (Signed)
ED Provider at bedside. 

## 2021-12-20 NOTE — ED Notes (Addendum)
Pt resting comfortably

## 2021-12-21 ENCOUNTER — Ambulatory Visit: Payer: Medicare Other | Admitting: Physician Assistant

## 2021-12-24 ENCOUNTER — Ambulatory Visit (INDEPENDENT_AMBULATORY_CARE_PROVIDER_SITE_OTHER): Payer: Medicare Other

## 2021-12-24 ENCOUNTER — Encounter: Payer: Self-pay | Admitting: Nurse Practitioner

## 2021-12-24 ENCOUNTER — Ambulatory Visit (INDEPENDENT_AMBULATORY_CARE_PROVIDER_SITE_OTHER): Payer: Medicare Other | Admitting: Nurse Practitioner

## 2021-12-24 VITALS — BP 130/60 | HR 56 | Ht 71.0 in | Wt 177.2 lb

## 2021-12-24 DIAGNOSIS — R001 Bradycardia, unspecified: Secondary | ICD-10-CM

## 2021-12-24 DIAGNOSIS — G459 Transient cerebral ischemic attack, unspecified: Secondary | ICD-10-CM

## 2021-12-24 DIAGNOSIS — R079 Chest pain, unspecified: Secondary | ICD-10-CM

## 2021-12-24 DIAGNOSIS — I10 Essential (primary) hypertension: Secondary | ICD-10-CM

## 2021-12-24 DIAGNOSIS — I441 Atrioventricular block, second degree: Secondary | ICD-10-CM

## 2021-12-24 DIAGNOSIS — E785 Hyperlipidemia, unspecified: Secondary | ICD-10-CM | POA: Diagnosis not present

## 2021-12-24 NOTE — Patient Instructions (Signed)
Medication Instructions:  Your physician recommends that you continue on your current medications as directed. Please refer to the Current Medication list given to you today.  *If you need a refill on your cardiac medications before your next appointment, please call your pharmacy*   Lab Work: None ordered If you have labs (blood work) drawn today and your tests are completely normal, you will receive your results only by: MyChart Message (if you have MyChart) OR A paper copy in the mail If you have any lab test that is abnormal or we need to change your treatment, we will call you to review the results.   Testing/Procedures: Your physician has requested that you have a lexiscan myoview. For further information please visit [email or web address] Please follow instruction sheet, as given.   Your provider has ordered a heart monitor to wear for 14 days. This will be mailed to your home with instructions on placement. Once you have finished the time frame requested, you will return monitor in box provided.      Follow-Up: At [facility], you and your health needs are our priority.  As part of our continuing mission to provide you with exceptional heart care, we have created designated Provider Care Teams.  These Care Teams include your primary Cardiologist (physician) and Advanced Practice Providers (APPs -  Physician Assistants and Nurse Practitioners) who all work together to provide you with the care you need, when you need it.  We recommend signing up for the patient portal called "MyChart".  Sign up information is provided on this After Visit Summary.  MyChart is used to connect with patients for Virtual Visits (Telemedicine).  Patients are able to view lab/test results, encounter notes, upcoming appointments, etc.  Non-urgent messages can be sent to your provider as well.   To learn more about what you can do with MyChart, go to [email or web address].    Your next appointment:    4-6 week(s)  The format for your next appointment:   In Person  Provider:   You may see Julien Nordmann, MD or one of the following Advanced Practice Providers on your designated Care Team:   Nicolasa Ducking, NP   Other Instructions [facility] MYOVIEW  Your caregiver has ordered a Stress Test with nuclear imaging. The purpose of this test is to evaluate the blood supply to your heart muscle. This procedure is referred to as a "Non-Invasive Stress Test." This is because other than having an IV started in your vein, nothing is inserted or "invades" your body. Cardiac stress tests are done to find areas of poor blood flow to the heart by determining the extent of coronary artery disease (CAD). Some patients exercise on a treadmill, which naturally increases the blood flow to your heart, while others who are  unable to walk on a treadmill due to physical limitations have a pharmacologic/chemical stress agent called Lexiscan . This medicine will mimic walking on a treadmill by temporarily increasing your coronary blood flow.   Please note: these test may take anywhere between 2-4 hours to complete  PLEASE REPORT TO [facility] MEDICAL MALL ENTRANCE  THE VOLUNTEERS AT THE FIRST DESK WILL DIRECT YOU WHERE TO GO  Date of Procedure:_____________________________________  Arrival Time for Procedure:______________________________   PLEASE NOTIFY THE OFFICE AT LEAST 24 HOURS IN ADVANCE IF YOU ARE UNABLE TO KEEP YOUR APPOINTMENT.  [phone] AND  PLEASE NOTIFY NUCLEAR MEDICINE AT [facility] AT LEAST 24 HOURS IN ADVANCE IF YOU ARE UNABLE TO KEEP YOUR APPOINTMENT. [phone]  How  to prepare for your Myoview test:  Do not eat or drink after midnight No caffeine for 24 hours prior to test No smoking 24 hours prior to test. Your medication may be taken with water.  If your doctor stopped a medication because of this test, do not take that medication. Please wear a short sleeve shirt. No cologne or lotion. Wear  comfortable walking shoes.     Important Information About Sugar

## 2021-12-24 NOTE — Progress Notes (Signed)
Office Visit    Patient Name: Carlos Frey Date of Encounter: 12/24/2021  Primary Care Provider:  [name], MD Primary Cardiologist:  Julien Nordmann, MD  Chief Complaint    88 year old male with a history of hypertension, hyperlipidemia, diastolic dysfunction, moderate aortic stenosis, sinus bradycardia, Mobitz 1 heart block, TIA, GERD, and Barrett's esophagus, who presents for follow-up after recent ED evaluation for chest pain.  Past Medical History    Past Medical History:  Diagnosis Date   AV block, Mobitz 1    Barrett esophagus    Diastolic dysfunction    GERD (gastroesophageal reflux disease)    Hematuria    a. 12/2021 in setting of DAPT-->plavix d/c'd.   Hyperlipidemia LDL goal <70    Hypertension    Moderate aortic stenosis    a. 11/2021 Echo: EF 55-60%, no rwma, GRI DD, nl RV fxn, mildly dil LA. Mod AS.   Sinus bradycardia    TIA (transient ischemic attack)    Past Surgical History:  Procedure Laterality Date   APPENDECTOMY     COLONOSCOPY  08/08/03, 12/05/08 & 12/14/13   ESOPHAGOGASTRODUODENOSCOPY  10/09/09, 12/25/09 & 03/24/12   ESOPHAGOGASTRODUODENOSCOPY (EGD) WITH PROPOFOL N/A 07/17/2017   Procedure: ESOPHAGOGASTRODUODENOSCOPY (EGD) WITH PROPOFOL;  Surgeon: Christena Deem, MD;  Location: [facility] ENDOSCOPY;  Service: Endoscopy;  Laterality: N/A;   TONSILLECTOMY      Allergies  No Known Allergies  History of Present Illness    88 year old male with above past medical history including hypertension, hyperlipidemia, diastolic dysfunction, moderate aortic stenosis, sinus bradycardia, Mobitz 1 heart block, TIA, GERD, and Barrett's esophagus.  He was previously evaluated by our team in October 2019 in the setting of near syncope with sinus bradycardia and Mobitz 1 heart block.  Echocardiogram at that time showed normal LV function with grade 1 diastolic dysfunction, and mild aortic stenosis.  He was felt to be dehydrated and outpatient diuretic therapy was  discontinued.  In the setting of unremarkable work-up, he did not require any cardiology follow-up.  In June 2023, he presented to the emergency department with confusion and anorexia.  He was again noted to be bradycardic with presence of Mobitz 1.  He was hemodynamically stable and showed good chronotropic competence with ambulation.  Repeat echocardiogram showed an EF of 55 to 60%, grade 1 diastolic dysfunction, and moderate aortic stenosis.  A 2-week ZIO monitor was ordered however at this time, no reports are available.  In the setting of confusion, he was worked up for stroke.  MRI showed old lacunar infarct without acute findings.  She was seen by neurology and placed on aspirin and Plavix.  Unfortunately, he presented again on July 1 with hematuria.  Plavix was discontinued.  CT urogram was reassuring and he was cleared to resume Plavix.  Unfortunately, he had recurrent hematuria with resumption of Plavix and this was ultimately discontinued and he was discharged home on aspirin only.  He was seen again in the emergency department on July 13 in the setting of left-sided chest discomfort and right-sided numbness, as well as difficulty finding his words.  EKG showed sinus bradycardia with first-degree AV block and intermittent Mobitz 1 heart block.  Labs were unremarkable.  MRI of the brain continue to show remote lacunar infarct of the left external capsule with chronic microvascular ischemic changes.  He was discharged home and advised to follow-up with cardiology.  [name] presents for follow-up today and notes that overall he feels well, though he has continued  to have intermittent and frequently constant, focal, left lower chest aching without associated symptoms, lasting sometimes hours at a time, and resolving after drinking something or ambulating.  This has been going on on an almost daily basis since his June admission.  With regards to the previously ordered monitor, he notes that when he  received in the mail, he was not sure how to put it on and so he just mailed it back to the company.  He would be interested in trying again if we could help him to get it on.  He denies dyspnea, palpitations, PND, orthopnea, dizziness, syncope, edema, or early satiety.  Home Medications    Current Outpatient Medications  Medication Sig Dispense Refill   amLODipine (NORVASC) 5 MG tablet Take 5 mg by mouth daily.     aspirin EC 81 MG tablet Take 1 tablet (81 mg total) by mouth daily. 30 tablet 11   atorvastatin (LIPITOR) 40 MG tablet Take 1 tablet (40 mg total) by mouth daily. 30 tablet 0   cetirizine (ZYRTEC) 10 MG tablet Take 10 mg by mouth daily.     citalopram (CELEXA) 10 MG tablet Take 5 mg by mouth daily.     lisinopril (ZESTRIL) 20 MG tablet Take 20 mg by mouth daily.     omeprazole (PRILOSEC) 20 MG capsule Take 20 mg by mouth daily.     No current facility-administered medications for this visit.     Review of Systems    Focal left-sided chest discomfort that occurs in almost a constant fashion, most days of the week.  Occasional confusion or difficulty finding his words.  He denies dyspnea, palpitations, PND, orthopnea, dizziness, syncope, edema, or early satiety.  All other systems reviewed and are otherwise negative except as noted above.    Physical Exam    VS:  BP 130/60 (BP Location: Left Arm, Patient Position: Sitting, Cuff Size: Normal)   Pulse (!) 56   Ht 5\' 11"  (1.803 m)   Wt 177 lb 4 oz (80.4 kg)   SpO2 98%   BMI 24.72 kg/m  , BMI Body mass index is 24.72 kg/m.     GEN: Well nourished, well developed, in no acute distress. HEENT: normal. Neck: Supple, no JVD, carotid bruits, or masses. Cardiac: RRR, 2/6 systolic murmur loudest at the upper sternal borders but heard throughout.  No gallops or rubs.. No clubbing, cyanosis, edema.  Radials/PT 2+ and equal bilaterally.  Respiratory:  Respirations regular and unlabored, clear to auscultation bilaterally. GI: Soft,  nontender, nondistended, BS + x 4. MS: no deformity or atrophy. Skin: warm and dry, no rash. Neuro:  Strength and sensation are intact. Psych: Normal affect.  Accessory Clinical Findings    ECG personally reviewed by me today -sinus bradycardia, 56, first-degree AV block, PAC, left axis deviation- no acute changes.    Lab Results  Component Value Date   WBC 8.5 12/20/2021   HGB 14.1 12/20/2021   HCT 42.0 12/20/2021   MCV 97.4 12/20/2021   PLT 177 12/20/2021   Lab Results  Component Value Date   CREATININE 0.75 12/20/2021   BUN 16 12/20/2021   NA 135 12/20/2021   K 4.0 12/20/2021   CL 103 12/20/2021   CO2 24 12/20/2021   Lab Results  Component Value Date   ALT 15 12/08/2021   AST 21 12/08/2021   ALKPHOS 68 12/08/2021   BILITOT 1.2 12/08/2021   Lab Results  Component Value Date   CHOL 127 12/09/2021  HDL 28 (L) 12/09/2021   LDLCALC 81 12/09/2021   TRIG 90 12/09/2021   CHOLHDL 4.5 12/09/2021    Lab Results  Component Value Date   HGBA1C 5.5 11/14/2021    Assessment & Plan    1.  Left-sided chest pain: Over the past month or more, patient has been having focal, left-sided chest discomfort that occurs most days of the week, can last hours at a time, and typically resolves after he drinks something or ambulates.  He was seen in the emergency department for similar discomfort on July 13 with normal troponins.  Of note, he did have mild troponin elevation in the setting of a TIA in early June.  Echocardiogram at that time showed normal LV function with moderate aortic stenosis.  The symptoms are atypical, given risk factors, I will arrange for a Lexiscan Myoview to rule out ischemia.  He remains on aspirin, statin, calcium channel blocker, and PPI therapy.  2.  Mobitz 1 AV block/sinus bradycardia: Patient previously discharged with an event monitor following his TIA in early June however, he could not figure out how to put it on and activated.  In that setting, we will  reorder and arrange for him to come into the office for a nurse visit to assist in putting it on.  3.  Essential hypertension: Stable on calcium channel blocker and ACE inhibitor therapy.  4.  Hyperlipidemia: LDL of 81 on July 2.  He is currently on statin therapy.  5.  Moderate aortic stenosis: Noted on echocardiogram in June.  Normal EF.  Denies presyncope/syncope or dyspnea.  Chest pain as outlined above.  Plan follow-up echo in a year.  6.  Hematuria: Resolved after stopping Plavix.  He will follow-up with urology.  7.  TIAs: Admitted in June with altered mental status.  Has had intermittent right and then left-sided numbness as well as difficulty finding words at times.  He is currently asymptomatic.  He remains on aspirin and statin therapy.  He plans to follow-up with primary care and does not currently have neurology follow-up scheduled.  8.  Disposition: Follow-up Lexiscan Myoview event monitor.  Follow-up in clinic in approximately 1 month.  Nicolasa Ducking, NP 12/24/2021, 2:19 PM

## 2021-12-25 ENCOUNTER — Ambulatory Visit: Payer: Medicare Other | Admitting: Nurse Practitioner

## 2021-12-26 ENCOUNTER — Ambulatory Visit: Payer: Medicare Other | Admitting: Medical

## 2021-12-27 ENCOUNTER — Telehealth: Payer: Self-pay | Admitting: Cardiovascular Disease

## 2021-12-27 NOTE — Telephone Encounter (Signed)
Spoke with the patient. Patient sts that he need assistance placing his zio monitor. Patient would like to come in to the office. Nurse visit scheduled on 12/31/21 @ 11am. Patient verbalized understanding and voiced appreciation for the call.

## 2021-12-27 NOTE — Telephone Encounter (Signed)
Pt would like to know when he is able to come I to have help applying monitor. Please advise

## 2021-12-28 ENCOUNTER — Telehealth: Payer: Self-pay | Admitting: Cardiovascular Disease

## 2021-12-28 NOTE — Telephone Encounter (Signed)
Spoke with patient and he states he has upcoming stress test and wanted to make sure he could wear monitor during that. He has not placed it and has appointment to come in for that. Advised that since he has not had it placed yet it may be best to wait until after that test is done. Advised I would have someone give him a call to reschedule that appointment. He verbalized understanding of our conversation with no further questions at this time.

## 2021-12-28 NOTE — Telephone Encounter (Signed)
Pt called wanting to know if its okay to wear his zio patch during his stress test 01/07/22.

## 2022-01-02 ENCOUNTER — Telehealth: Payer: Self-pay | Admitting: Cardiovascular Disease

## 2022-01-02 DIAGNOSIS — I441 Atrioventricular block, second degree: Secondary | ICD-10-CM | POA: Diagnosis not present

## 2022-01-02 NOTE — Telephone Encounter (Signed)
Pt c/o of Chest Pain: STAT if CP now or developed within 24 hours  1. Are you having CP right now? Yes   2. Are you experiencing any other symptoms (ex. SOB, nausea, vomiting, sweating)? Sweating, dull achy feeling, knees feel funny, a little lightheaded  3. How long have you been experiencing CP? This morning  4. Is your CP continuous or coming and going? continuous  5. Have you taken Nitroglycerin? No  ?

## 2022-01-02 NOTE — Telephone Encounter (Signed)
Spoke with patient and he stated that the CP he has been having off and on since June is happening again and he would really like to have his Zio put on now. I advised patient to come in now and I can apply his monitor for him at this time. Per our Cardiac Radiologist, Dr. Azucena Cecil, and the [facility] website, Zio monitors can be worn during a Myoview.

## 2022-01-03 NOTE — Telephone Encounter (Signed)
Confirmed via ziosuite that pt's Zio AT monitor has been placed and is active.  Myoview scheduled 02/07/22.  Pt to follow up in office 02/06/22.

## 2022-01-06 ENCOUNTER — Telehealth: Payer: Self-pay | Admitting: Physician Assistant

## 2022-01-06 NOTE — Telephone Encounter (Addendum)
Pt had an 8.6 sec pause, 6:51 am, automatic trigger but when contacted, the patient stated he felt lightheaded and dizzy at the time.  He did not fall or completely lose consciousness.   I called this patient and spoke to myself.  He is not sure if he was awake or asleep as the event happened at 6:51.  He remembered that he had been up to the restroom at 1 point and was not feeling well.  However, he has problems every morning before he gets up, gets his medications on board and has something to eat.  He did not note that he felt that much different today.  These were from your symptoms to him so he did not panic.  His symptoms resolved.  Follow-up check showed sinus rhythm.  The strips were reviewed and he did have a prolonged sinus pause.  He was advised to let the monitor company know or document somehow if he has any more symptoms of being lightheaded or dizzy, and contact us.  Dr. Elberta Fortis was asked if it would be okay for him to have his [facility] tomorrow, and he said it was.  The patient stated he would keep this appointment.   Theodore Demark, PA-C 01/06/2022 9:18 AM

## 2022-01-07 ENCOUNTER — Encounter
Admission: RE | Admit: 2022-01-07 | Discharge: 2022-01-07 | Disposition: A | Discharge status: Home / Self Care | Payer: Medicare Other | Source: Ambulatory Visit | Attending: Nurse Practitioner | Admitting: Nurse Practitioner

## 2022-01-07 ENCOUNTER — Telehealth: Payer: Self-pay | Admitting: Cardiovascular Disease

## 2022-01-07 DIAGNOSIS — R079 Chest pain, unspecified: Secondary | ICD-10-CM | POA: Insufficient documentation

## 2022-01-07 LAB — NM MYOCAR MULTI W/SPECT W/WALL MOTION / EF
Base ST Depression (mm): 0 mm
Peak HR: 79 {beats}/min
Percent HR: 60 %
Rest HR: 50 {beats}/min
ST Depression (mm): 0 mm

## 2022-01-07 MED ORDER — APIXABAN 5 MG PO TABS: 5.0000 mg | ORAL_TABLET | Freq: Two times a day (BID) | ORAL | 6 refills | Status: DC

## 2022-01-07 MED ORDER — REGADENOSON 0.4 MG/5ML IV SOLN
0.4000 mg | Freq: Once | INTRAVENOUS | Status: AC
  Administered 2022-01-07: 0.4 mg via INTRAVENOUS
  Filled 2022-01-07: qty 5

## 2022-01-07 MED ORDER — TECHNETIUM TC 99M TETROFOSMIN IV KIT
10.0000 | PACK | Freq: Once | INTRAVENOUS | Status: AC
  Administered 2022-01-07: 9.87 via INTRAVENOUS

## 2022-01-07 MED ORDER — TECHNETIUM TC 99M TETROFOSMIN IV KIT
30.0000 | PACK | Freq: Once | INTRAVENOUS | Status: AC
  Administered 2022-01-07: 27.73 via INTRAVENOUS

## 2022-01-07 NOTE — Telephone Encounter (Signed)
Attempted to call [employer] @ [phone]. 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  Called [employer] at [phone]. I spoke with rep- Bill.     Cardiac Monitor Alert  Date of alert:  01/07/2022   Patient Name: Carlos Frey  DOB: 1930/05/06  MRN: [id number]   [facility] Cardiologist: Julien Nordmann, MD  [facility] EP:  None    Monitor Information: Long Term Monitor-Live Telemetry [ZioAT]  Reason:  Sinus Bradycardia & TIA's Ordering provider:  Ward Givens, NP   Alert Atrial Fibrillation/Flutter This is the 1st alert for this rhythm.  Per [employer]- today at 1254- 1st notification for atrial fibrillation with a HR of 58-90 bpm (average HR 74 bpm) x 90 seconds.  The patient has no hx of Atrial Fibrillation/Flutter.  The patient is not currently on anticoagulation.  Next Cardiology Appointment   Date:  02/06/22  Provider:  Ward Givens, NP  The patient was contacted today.  He is asymptomatic. I called and confirmed with the patient that he was experiencing no symptoms at the time of his reported event. The patient stated he was just finishing his stress test about this time and had just reapplied his monitor. The patient was advised I will need to review with Dr. [name] and we will call him back with any further recommendations.     Other: A second call was received this afternoon for the same episode.  When reviewing ZIO suites, it looks like the patient had an additional episode of a-fib at 2:19 pm: HR ranged from 74-106 bpm (average HR- 92 bpm).  Tracings from both events pulled for Dr. [name] to review.   Sherri Rad, RN  01/07/2022 3:50 PM

## 2022-01-07 NOTE — Telephone Encounter (Signed)
Another encounter was opened at 2:49 pm for this same issue. - See initial encounter.

## 2022-01-07 NOTE — Telephone Encounter (Signed)
Calling with abnormal EKG results. Please advise   Called twice and no one answer.

## 2022-01-07 NOTE — Telephone Encounter (Signed)
Firoza from [employer] calling with monitor results. Phone: [phone] Ticket number: [id number]

## 2022-01-07 NOTE — Telephone Encounter (Signed)
Prior phone note indicates this finding was reviewed by EP.  Continue cardiac monitoring and follow-up as planned.

## 2022-01-07 NOTE — Telephone Encounter (Signed)
Reviewed the patient's monitor tracings with Dr. [name]. Atrial fibrillation confirmed x 2 episodes today (1254 & 1419). The patient did have a 8.6 second pause on 01/06/22 (0651).  Per Dr. [name]: 1) START Eliquis 5 mg BID - Age: 88 - Wt: 80.4 kg - Creatinine: 0.8 (01/01/22) (Patient has some concerns about the cost of eliquis. I advised I will pull samples for him to have and provide an assistance application to complete. He is aware that due to the time of day, and that our office will be closing soon, I will send in his RX to the pharmacy with the ID #'s for the Free 30-day trial card for eliquis.)  2) STOP Aspirin  3) If bradycardia occurred in the context of sleep/ or strain from a BM/ urination, will continue to monitor. - If bradycardia was while awake, but not with bearing down, will need to refer to EP.   I called and spoke with the patient.  He is aware of the above findings and recommendations.  He voices understanding and is agreeable.  I have advised him that per Dr. [name], he should push the button on his ZIO monitor for any sx of weakness/ cloudy vision as he has reported this in the past.   He is advised to keep his follow up with Ward Givens, NP on 02/06/22.   The patient was very appreciative of the call.   Samples Given:  Eliquis 5 mg Lot: [id number] Exp: April 2025 # 2 boxes  Lot: [id number] Exp: April 2025 # 2 boxes

## 2022-01-08 DIAGNOSIS — I480 Paroxysmal atrial fibrillation: Secondary | ICD-10-CM | POA: Diagnosis present

## 2022-01-09 ENCOUNTER — Other Ambulatory Visit: Payer: Medicare Other | Admitting: Urology

## 2022-01-16 ENCOUNTER — Telehealth: Payer: Self-pay | Admitting: Cardiology

## 2022-01-16 NOTE — Telephone Encounter (Signed)
I was notified by [employer] that patient had an abnormal EKG overnight. Patient had a 5.9 second pause at 3:14 AM, underlying rhythm was sinus. Follow-up check showed sinus rhythm.  I reviewed the rhythm strips personally. Showed sinus rhythm with first degree AV block, HR 49 BPM. There was a 5.9 second sinus pause at 3:14 AM  I personally spoke with the patient who told me that he was asleep at 3:14 AM and does not recall having any symptoms overnight. Reports he was in his usual state of health yesterday and is feeling well this AM. I encouraged patient to continue to wear his monitor as instructed. Also encouraged him to call the office if he has any dizziness, lightheadedness, palpitations, chest pain, syncope/near syncope.   He has a follow up appointment with Ward Givens, NP on 8/30.   Jonita Albee, PA-C 01/16/2022 7:01 AM

## 2022-01-17 ENCOUNTER — Telehealth: Payer: Self-pay | Admitting: Cardiovascular Disease

## 2022-01-17 MED ORDER — APIXABAN 5 MG PO TABS: ORAL_TABLET | ORAL | 1 refills | Status: DC

## 2022-01-17 NOTE — Telephone Encounter (Signed)
Refill request

## 2022-01-17 NOTE — Telephone Encounter (Signed)
*  STAT* If patient is at the pharmacy, call can be transferred to refill team.   1. Which medications need to be refilled? (please list name of each medication and dose if known)   apixaban (ELIQUIS) 5 MG TABS tablet  2. Which pharmacy/location (including street and city if local pharmacy) is medication to be sent to?  [facility] - [place], [place] - [address] [place]   3. Do they need a 30 day or 90 day supply?  90 day  Patient stated he still has some medication.

## 2022-01-17 NOTE — Telephone Encounter (Signed)
Patient called to give fax number to [facility] [phone] or [phone]

## 2022-01-17 NOTE — Telephone Encounter (Signed)
Prescription refill request for Eliquis received. Indication:TIA Last office visit:7/23 Scr:0.8 Age: 88 Weight:80.4 kg  Prescription refilled

## 2022-01-24 ENCOUNTER — Telehealth: Payer: Self-pay | Admitting: Cardiovascular Disease

## 2022-01-24 ENCOUNTER — Other Ambulatory Visit: Payer: Self-pay | Admitting: *Deleted

## 2022-01-24 NOTE — Telephone Encounter (Signed)
Eliquis 5mg  refill request received. Patient is 88 years old, weight-80.4kg, Crea-0.75 on 12/20/2021, Diagnosis-TIA, and last seen by 12/22/2021 on 12/24/2021. Dose is appropriate based on dosing criteria. Refill was sent on 01/17/22 to same pharmacy. 03/19/22 and spoke with [employer]. She stated that the request was received and since it was too soon to fill the computer will keep sending the request. She stated she will stop the duplicate request from being faxed over and they will fill the rx on 01/28/2022 and send out.

## 2022-01-24 NOTE — Telephone Encounter (Signed)
Received call transferred directly to this RN. Danielle with [employer] states that a report was given to our office on 01/07/22 that pt had episode of afib. Per Duwayne Heck, after further review, this rhythm was actually sinus rhythm with poor baseline due to artifact.   Unable to locate provider in office. Forwarding to Dr. [name] to notify for further recc.

## 2022-01-24 NOTE — Telephone Encounter (Signed)
Caller wanted to give ZIO report.  Call transferred to Triage nurse.

## 2022-01-24 NOTE — Telephone Encounter (Signed)
Discussed with Dr. [name]. Notified pt per Dr. [name] to stop Eliquis at this time, but keep on hand. And restart Aspirin 81 mg daily tomorrow morning. Will need to review final report from zio as 01/07/22 report was from [employer].  Follow up as scheduled 02/06/22 with Ward Givens, NP.   Pt appreciative and voiced understanding. Pt will not take Eliquis tonight and will start Aspirin 81 mg daily  beginning tomorrow.

## 2022-01-28 MED ORDER — ASPIRIN 81 MG PO TBEC: 81.0000 mg | DELAYED_RELEASE_TABLET | Freq: Every day | ORAL | 3 refills | Status: DC

## 2022-01-28 NOTE — Addendum Note (Signed)
Addended by: Virl Axe, Eunice Oldaker L on: 01/28/2022 04:51 PM   Modules accepted: Orders

## 2022-01-28 NOTE — Telephone Encounter (Signed)
Calling back with some more questions. Please advise

## 2022-01-28 NOTE — Telephone Encounter (Signed)
Called patient back. Patient is having no issues.

## 2022-01-29 ENCOUNTER — Other Ambulatory Visit: Payer: Self-pay | Admitting: *Deleted

## 2022-01-29 ENCOUNTER — Telehealth: Payer: Self-pay | Admitting: Cardiovascular Disease

## 2022-01-29 DIAGNOSIS — I440 Atrioventricular block, first degree: Secondary | ICD-10-CM

## 2022-01-29 DIAGNOSIS — I441 Atrioventricular block, second degree: Secondary | ICD-10-CM

## 2022-01-29 NOTE — Telephone Encounter (Signed)
Danielle from [facility] is calling to get clarification on the medication Eliquis. Requesting to know if the doctor will be leaving this pt on this med.

## 2022-01-29 NOTE — Telephone Encounter (Signed)
Will forward request to Ward Givens NP and covering, to further advise if Eliquis should be stopped indefinitely, or currently just on a temporary hold (based on telephone encounter from 8/17).  Monitor results provided to the pt this morning, by RN.  See monitor results for details.

## 2022-01-29 NOTE — Telephone Encounter (Signed)
Spoke with Duwayne Heck and stated there she inquired if the eliquis had been stopped on this patient. Advised that I would review his chart and give her a call back. She verbalized understanding

## 2022-01-29 NOTE — Telephone Encounter (Signed)
Carlos Frey from [facility] stated there was a incorrect reading and that patient did not have atib. Monitor event strips are not in final report. These strips were printed from zio suite site and provided to Dr. [name].   Strips have been printed and reviewed by Dr. [name]. He is requesting more strips/information on these reports. Discussed with Carlos Frey at [facility] and she will get the data requested for this monitor. Advised that I am off tomorrow so she can reach out to [facility] if needed. She is working on getting this information and will give strips which were not in the report to [name].

## 2022-01-31 ENCOUNTER — Telehealth: Payer: Self-pay | Admitting: *Deleted

## 2022-01-31 MED ORDER — APIXABAN 5 MG PO TABS: 5.0000 mg | ORAL_TABLET | Freq: Two times a day (BID) | ORAL | 11 refills | Status: DC

## 2022-01-31 NOTE — Telephone Encounter (Signed)
Spoke with patient and reviewed recommendations. He verbalized understanding and was agreeable with this plan. Offered assistance with application and he declined stating that his insurance has been helping with that and he has plenty at this time. Confirmed upcoming appointments and he verbalized understanding with no further questions at this time.

## 2022-01-31 NOTE — Telephone Encounter (Signed)
See result note for further recommendations.

## 2022-01-31 NOTE — Telephone Encounter (Signed)
-----   Message from Creig Hines, NP sent at 01/30/2022  1:19 PM EDT ----- We received additional strips from the monitoring company this week.  Upon further review of rhythms, we have identified episodes of atrial fibrillation that were not initially reported.  In that setting we do recommend resumption of eliquis 5mg  BID and follow-up as planned.

## 2022-02-06 ENCOUNTER — Ambulatory Visit: Payer: Medicare Other | Attending: Nurse Practitioner | Admitting: Nurse Practitioner

## 2022-02-06 ENCOUNTER — Encounter: Payer: Self-pay | Admitting: Nurse Practitioner

## 2022-02-06 ENCOUNTER — Other Ambulatory Visit
Admission: RE | Admit: 2022-02-06 | Discharge: 2022-02-06 | Disposition: A | Discharge status: Home / Self Care | Payer: Medicare Other | Source: Ambulatory Visit | Attending: Nurse Practitioner | Admitting: Nurse Practitioner

## 2022-02-06 VITALS — BP 178/80 | HR 62 | Ht 71.0 in | Wt 182.8 lb

## 2022-02-06 DIAGNOSIS — E782 Mixed hyperlipidemia: Secondary | ICD-10-CM

## 2022-02-06 DIAGNOSIS — I10 Essential (primary) hypertension: Secondary | ICD-10-CM | POA: Diagnosis not present

## 2022-02-06 DIAGNOSIS — I480 Paroxysmal atrial fibrillation: Secondary | ICD-10-CM | POA: Insufficient documentation

## 2022-02-06 DIAGNOSIS — I441 Atrioventricular block, second degree: Secondary | ICD-10-CM

## 2022-02-06 DIAGNOSIS — G459 Transient cerebral ischemic attack, unspecified: Secondary | ICD-10-CM | POA: Diagnosis not present

## 2022-02-06 DIAGNOSIS — I350 Nonrheumatic aortic (valve) stenosis: Secondary | ICD-10-CM

## 2022-02-06 DIAGNOSIS — R001 Bradycardia, unspecified: Secondary | ICD-10-CM

## 2022-02-06 DIAGNOSIS — I455 Other specified heart block: Secondary | ICD-10-CM

## 2022-02-06 LAB — CBC
HCT: 41.5 % (ref 39.0–52.0)
Hemoglobin: 14.1 g/dL (ref 13.0–17.0)
MCH: 33.3 pg (ref 26.0–34.0)
MCHC: 34 g/dL (ref 30.0–36.0)
MCV: 98.1 fL (ref 80.0–100.0)
Platelets: 180 10*3/uL (ref 150–400)
RBC: 4.23 MIL/uL (ref 4.22–5.81)
RDW: 13.1 % (ref 11.5–15.5)
WBC: 7.8 10*3/uL (ref 4.0–10.5)
nRBC: 0 % (ref 0.0–0.2)

## 2022-02-06 LAB — BASIC METABOLIC PANEL
Anion gap: 5 (ref 5–15)
BUN: 15 mg/dL (ref 8–23)
CO2: 28 mmol/L (ref 22–32)
Calcium: 9.3 mg/dL (ref 8.9–10.3)
Chloride: 105 mmol/L (ref 98–111)
Creatinine, Ser: 0.88 mg/dL (ref 0.61–1.24)
GFR, Estimated: >60 mL/min (ref >60)
Glucose, Bld: 103 mg/dL — ABNORMAL HIGH (ref 70–99)
Potassium: 4.7 mmol/L (ref 3.5–5.1)
Sodium: 138 mmol/L (ref 135–145)

## 2022-02-06 MED ORDER — AMLODIPINE BESYLATE 10 MG PO TABS: 10.0000 mg | ORAL_TABLET | Freq: Every day | ORAL | 3 refills | Status: DC

## 2022-02-06 NOTE — Progress Notes (Signed)
Office Visit    Patient Name: Carlos Frey Date of Encounter: 02/06/2022  Primary Care Provider:  [name], MD Primary Cardiologist:  Julien Nordmann, MD  Chief Complaint    88 year old male with a history of hypertension, hyperlipidemia, diastolic dysfunction, moderate aortic stenosis, sinus bradycardia, Mobitz 1 heart block, TIA, GERD, and Barrett's esophagus, who presents for follow-up of recently diagnosed paroxysmal atrial fibrillation.  Past Medical History    Past Medical History:  Diagnosis Date   AV block, Mobitz 1    a. 12/2021 Zio: 56 pauses, longest of which 8.6 seconds.  Most occurred throughout periods of sleep or early morning hours.   Barrett esophagus    Chest pain    a. 12/2021 MV: EF 55-65%. No ischemia/infarct. Low risk study. CT imaging w/ Ao and Cor Ca2+.   Diastolic dysfunction    GERD (gastroesophageal reflux disease)    Hematuria    a. 12/2021 in setting of DAPT-->plavix d/c'd.   Hyperlipidemia LDL goal <70    Hypertension    Left carotid bruit    a. 12/2021 MRA neck: No hemodynamically significant carotid arterial stenoses.   Moderate aortic stenosis    a. 11/2021 Echo: EF 55-60%, no rwma, GRI DD, nl RV fxn, mildly dil LA. Mod AS.   PAF (paroxysmal atrial fibrillation) (HCC)    a. 12/2021 Zio: Predominantly sinus rhythm at 62 bpm.  5 brief runs of nonsustained VT 56 pauses, longest of which lasted 8.6 seconds.  Mobitz 1 heart block noted.  Brief episode of atrial fibrillation-->Eliquis started.   Sinus bradycardia    TIA (transient ischemic attack)    Past Surgical History:  Procedure Laterality Date   APPENDECTOMY     COLONOSCOPY  08/08/03, 12/05/08 & 12/14/13   ESOPHAGOGASTRODUODENOSCOPY  10/09/09, 12/25/09 & 03/24/12   ESOPHAGOGASTRODUODENOSCOPY (EGD) WITH PROPOFOL N/A 07/17/2017   Procedure: ESOPHAGOGASTRODUODENOSCOPY (EGD) WITH PROPOFOL;  Surgeon: Christena Deem, MD;  Location: [facility] ENDOSCOPY;  Service: Endoscopy;  Laterality: N/A;    TONSILLECTOMY      Allergies  No Known Allergies  History of Present Illness    88 year old male with above past medical history including hypertension, hyperlipidemia, diastolic dysfunction, moderate aortic stenosis, sinus bradycardia, Mobitz 1 heart block, TIA, GERD, and Barrett's esophagus.  He was previously evaluated by our team in October 2019 in the setting of near syncope with sinus bradycardia and Mobitz 1 heart block.  Echocardiogram at that time showed normal LV function with grade 1 diastolic dysfunction, and mild aortic stenosis.  He was felt to be dehydrated and outpatient diuretic therapy was discontinued.  In the setting of unremarkable work-up, he did not require any cardiology follow-up.  In June 2023, he presented to the emergency department with confusion and anorexia.  He was again noted to be bradycardic with presence of Mobitz 1 heart block.  He was hemodynamically stable and showed good chronotropic competence with ambulation.  Repeat echocardiogram showed EF of 55 to 60% with grade 1 diastolic dysfunction and moderate aortic stenosis.  MRI was performed and showed old lacunar infarct without acute findings.  He was seen by neurology and placed on aspirin and Plavix.  He presented back to emergency department July 1 with hematuria and Plavix was discontinued.  CT urogram was reassuring and he was cleared to resume Plavix but then had recurrent hematuria and this was ultimately discontinued.  On December 20, 2021, he was seen in the emergency department with left-sided chest discomfort and right-sided numbness, as well  as difficulty finding his words.  EKG showed sinus bradycardia with first-degree AV block and intermittent Mobitz 1 heart block.  Labs were unremarkable.  Repeat MRI of the brain continue to show remote lacunar infarcts of the left external capsule with chronic microvascular ischemic changes.  He was discharged home and advised to follow-up with cardiology.  At office  follow-up on July 17, he reported intermittent and often prolonged episodes of focal, left lower chest aching without associated symptoms, lasting hours at a time and resolving after drinking something or ambulating.  He also reported that previously ordered ZIO monitor was too difficult to place and therefore he returned it.  We ordered a Lexiscan Myoview in the setting of chest discomfort and this was low risk without ischemia or infarct.  CT imaging did show aortic and coronary atherosclerosis.  We also ordered a repeat Zio monitor, which showed a brief episode of paroxysmal atrial fibrillation and he was placed on Eliquis.  He was also noted to have 5 brief runs of nonsustained VT and 56 pauses, the longest of which lasted 8.6 seconds.  Pauses were frequently occurring in the middle of the night or in the early morning hours.  Today, Mr. Kyser reports feeling remarkably well.  He says ever since being placed on Eliquis, he has felt "normal."  He denies chest pain, dyspnea, palpitations, PND, orthopnea, dizziness, syncope, edema, or early satiety.  He denies any symptoms suggestive of presyncope or syncope during his monitoring period.  He says he typically awakens sometimes between 6 and 8 AM but might be in and out of sleep until about 8 AM, when he officially gets out of bed.  Home Medications    Current Outpatient Medications  Medication Sig Dispense Refill   amLODipine (NORVASC) 10 MG tablet Take 1 tablet (10 mg total) by mouth daily. 180 tablet 3   apixaban (ELIQUIS) 5 MG TABS tablet Take 1 tablet (5 mg total) by mouth 2 (two) times daily. 60 tablet 11   atorvastatin (LIPITOR) 40 MG tablet Take 1 tablet (40 mg total) by mouth daily. 30 tablet 0   cetirizine (ZYRTEC) 10 MG tablet Take 10 mg by mouth daily.     citalopram (CELEXA) 10 MG tablet Take 5 mg by mouth daily.     lisinopril (ZESTRIL) 20 MG tablet Take 20 mg by mouth daily.     omeprazole (PRILOSEC) 20 MG capsule Take 20 mg by mouth  daily.     polyethylene glycol (MIRALAX / GLYCOLAX) 17 g packet Take 17 g by mouth daily.     aspirin EC 81 MG tablet Take 1 tablet (81 mg total) by mouth daily. Swallow whole. (Patient not taking: Reported on 02/06/2022) 90 tablet 3   No current facility-administered medications for this visit.     Review of Systems    Feels well over the past few weeks. He denies chest pain, palpitations, dyspnea, pnd, orthopnea, n, v, dizziness, syncope, edema, weight gain, or early satiety.  All other systems reviewed and are otherwise negative except as noted above.    Physical Exam    VS:  BP (!) 171/70 (BP Location: Left Arm, Patient Position: Sitting, Cuff Size: Normal)   Pulse 62   Ht 5\' 11"  (1.803 m)   Wt 182 lb 12.8 oz (82.9 kg)   SpO2 96%   BMI 25.50 kg/m  , BMI Body mass index is 25.5 kg/m.     Vitals:   02/06/22 1511 02/06/22 1716  BP: (!) 171/70 02/08/22)  178/80  Pulse: 62   SpO2: 96%     GEN: Well nourished, well developed, in no acute distress. HEENT: normal. Neck: Supple, no JVD.  Left carotid bruit versus radiated murmur. Cardiac: RRR, 2/6 systolic murmur at the upper sternal borders but heard throughout.  No rubs or gallops.  No clubbing, cyanosis, edema.  Radials/PT 2+ and equal bilaterally.  Respiratory:  Respirations regular and unlabored, clear to auscultation bilaterally. GI: Soft, nontender, nondistended, BS + x 4. MS: no deformity or atrophy. Skin: warm and dry, no rash. Neuro:  Strength and sensation are intact. Psych: Normal affect.  Accessory Clinical Findings    ECG personally reviewed by me today -sinus rhythm with sinus arrhythmia versus Mobitz 1.  First-degree AV block with PR interval of 416 ms.- no acute changes.  Lab Results  Component Value Date   WBC 7.8 02/06/2022   HGB 14.1 02/06/2022   HCT 41.5 02/06/2022   MCV 98.1 02/06/2022   PLT 180 02/06/2022   Lab Results  Component Value Date   CREATININE 0.88 02/06/2022   BUN 15 02/06/2022   NA 138  02/06/2022   K 4.7 02/06/2022   CL 105 02/06/2022   CO2 28 02/06/2022   Lab Results  Component Value Date   ALT 15 12/08/2021   AST 21 12/08/2021   ALKPHOS 68 12/08/2021   BILITOT 1.2 12/08/2021   Lab Results  Component Value Date   CHOL 127 12/09/2021   HDL 28 (L) 12/09/2021   LDLCALC 81 12/09/2021   TRIG 90 12/09/2021   CHOLHDL 4.5 12/09/2021    Lab Results  Component Value Date   HGBA1C 5.5 11/14/2021    Assessment & Plan    1.  Paroxysmal atrial fibrillation: Patient recently wore a ZIO monitor in the setting of hospital presentations for presumed TIAs (MRIs without acute findings though old lacunar stroke noted).  Initially finalized report did not indicate atrial fibrillation however as additional information was obtained, patient was found to have a brief run of atrial fibrillation and has been placed on Eliquis.  He is no longer on aspirin in that setting.  He is not on AV node blocking agent in the setting of bradycardia and pauses noted during the monitoring period.  He had no triggered events while monitored and denies any history of palpitations.  Though A-fib episode was brief, with his history of presumed TIA, we will continue oral anticoagulation.  2.  Mobitz 1 AV block/first-degree AV block/sinus bradycardia/sinus pauses: Patient with prior history of bradycardia and Mobitz 1.  Recent monitoring showed pauses up to 8.6 seconds, predominantly occurring in the early morning hours though several between 6 and 7 AM.  Patient says he may or may not have been awake during these episodes did not experience any presyncope or syncope.  With significant conduction disease at baseline and prolonged pauses noted, he has already been referred to electrophysiology and has appointment next week.  I will also make a pulmonology referral for sleep study.  Continue to avoid AV nodal blocking agents.  3.  History of chest pain: Recent low risk stress test.  No recurrent chest pain.  Echo  previously showed normal LV function with moderate aortic stenosis.  Did have coronary and aortic calcifications on stress testing, and is on a statin.  No aspirin in the setting of Eliquis.  4.  Essential hypertension: Blood pressure is elevated today at 171/70 and 178/80 on repeat.  Increasing his amlodipine to 10 mg daily.  He  otherwise remains on lisinopril 20 mg daily.  Patient does have a cuff at home and I encouraged him to check his pressure daily contact us with numbers.  5.  Hyperlipidemia: LDL 81 on July 2.  Continue statin therapy.  6.  Moderate aortic stenosis: Noted on echocardiogram in June 2023.  Normal LV function.  Denies presyncope or syncope.  No recurrent chest pain.  Plan to follow-up echo next year.  7.  TIAs: Previous admissions with altered mental status and word finding.  Seen by neurology on the hospital side with MRIs showing no acute findings.  He does have a left carotid bruit no MRA did not show any significant disease.  He is now on Eliquis in the setting of finding of paroxysmal atrial fibrillation.  No recurrent symptoms.  He plans to continue follow-up with his primary care.  8.  History of hematuria: This occurred while on Plavix.  No recurrence following initiation of Eliquis.  Discussed the importance of tracking this closely.  We will follow-up a CBC today.  9.  Disposition: Refer to pulmonology for sleep study.  Follow-up with CBC and basic metabolic panel in the setting of new Eliquis therapy.  He has follow-up with electrophysiology on September 7.  We will plan for general cardiology follow-up in approximately 2 to 3 months.  Nicolasa Ducking, NP 02/06/2022, 5:11 PM

## 2022-02-06 NOTE — Patient Instructions (Addendum)
Medication Instructions:   Your physician has recommended you make the following change in your medication:   STOP Amlodipine 5 mg tablet may finish current tablets until finished. Please take Amlodipine 5 mg tablet 2 tablets ( 10 mg) by mouth daily.  -START Amlodipine 10 mg tablet by mouth daily.(Once Amlodipine 5 mg tablets are completed)  *If you need a refill on your cardiac medications before your next appointment, please call your pharmacy*   Lab Work:  Your physician recommends that you return for lab work at [facility] today.  -CBC/ BMP  If you have labs (blood work) drawn today and your tests are completely normal, you will receive your results only by: MyChart Message (if you have MyChart) OR A paper copy in the mail If you have any lab test that is abnormal or we need to change your treatment, we will call you to review the results.   Testing/Procedures:  NONE  Follow-Up: At [facility], you and your health needs are our priority.  As part of our continuing mission to provide you with exceptional heart care, we have created designated Provider Care Teams.  These Care Teams include your primary Cardiologist (physician) and Advanced Practice Providers (APPs -  Physician Assistants and Nurse Practitioners) who all work together to provide you with the care you need, when you need it.  We recommend signing up for the patient portal called "MyChart".  Sign up information is provided on this After Visit Summary.  MyChart is used to connect with patients for Virtual Visits (Telemedicine).  Patients are able to view lab/test results, encounter notes, upcoming appointments, etc.  Non-urgent messages can be sent to your provider as well.   To learn more about what you can do with MyChart, go to [email or web address].    Your next appointment:   3 month(s)  The format for your next appointment:   In Person  Provider:   You may see Julien Nordmann, MD or one of the  following Advanced Practice Providers on your designated Care Team:   Nicolasa Ducking, NP Eula Listen, PA-C Cadence Fransico Michael, PA-C Charlsie Quest, NP  Important Information About Sugar

## 2022-02-08 ENCOUNTER — Telehealth: Payer: Self-pay

## 2022-02-08 NOTE — Telephone Encounter (Signed)
Spoke w/ pt's wife.   Advised her of Chris's recommendation. She verbalizes understanding and is agreeable.

## 2022-02-08 NOTE — Telephone Encounter (Signed)
-----   Message from Jefferey Pica, RN sent at 02/07/2022 12:10 PM EDT -----  ----- Message ----- From: Creig Hines, NP Sent: 02/06/2022   5:48 PM EDT To: Bryna Colander, RN  Kidney function, electrolytes, and blood counts are stable on Eliquis.

## 2022-02-14 ENCOUNTER — Telehealth: Payer: Self-pay | Admitting: Internal Medicine

## 2022-02-14 ENCOUNTER — Ambulatory Visit: Payer: Medicare Other | Attending: Internal Medicine | Admitting: Internal Medicine

## 2022-02-14 ENCOUNTER — Institutional Professional Consult (permissible substitution): Payer: Medicare Other | Admitting: Internal Medicine

## 2022-02-14 ENCOUNTER — Encounter: Payer: Self-pay | Admitting: Internal Medicine

## 2022-02-14 VITALS — BP 140/60 | HR 58 | Ht 66.0 in | Wt 180.5 lb

## 2022-02-14 DIAGNOSIS — I480 Paroxysmal atrial fibrillation: Secondary | ICD-10-CM

## 2022-02-14 DIAGNOSIS — I441 Atrioventricular block, second degree: Secondary | ICD-10-CM | POA: Diagnosis not present

## 2022-02-14 DIAGNOSIS — I440 Atrioventricular block, first degree: Secondary | ICD-10-CM | POA: Diagnosis not present

## 2022-02-14 DIAGNOSIS — R001 Bradycardia, unspecified: Secondary | ICD-10-CM

## 2022-02-14 DIAGNOSIS — I455 Other specified heart block: Secondary | ICD-10-CM

## 2022-02-14 NOTE — Telephone Encounter (Signed)
Patient is wanting to confirm the heart monitor findings discussed with Dr. Graciela Husbands at his appt today are in his chart for other practices to be able to access and see. Please advise.

## 2022-02-14 NOTE — Progress Notes (Signed)
ELECTROPHYSIOLOGY CONSULT NOTE  Patient ID: Carlos Frey, MRN: [id number], DOB/AGE: 86-Mar-1931 86 y.o. Admit date: (Not on file) Date of Consult: 02/14/2022  Primary Physician: [name], MD Primary Cardiologist: Knute Neu     Carlos Frey is a 88 y.o. male who is being seen today for the evaluation of afib and abnormal monitor at the request of CB/TG.    HPI Carlos Frey is a 88 y.o. male referred for possible atrial fibrillation in setting of prior TIA and MRI stroke   2019 seen for Mobitz 1 heart block history of near syncope.  6/23 presented with confusion and anorexia, evaluation for stroke showed an old lacunar infarct.  Per neurology was treated with DAPT, ultimately complicated by hematuria resolved and the discontinuation of Plavix and resuming of aspirin.  7/23 he had right body numbness and word finding difficulty; MRI was unchanged. Also had recurrent chest discomfort occurring almost daily.  Subsequent Myoview was negative    DATE TEST EF   6/23 Echo   55-65 % LAE mild AoV Grad- mean 35mm  7/23 Myoview  55-65 % No ischemia        Date Cr K Hgb  8/23 0.88 4.7 14.1         Thromboembolic risk factors ( age  -2 , HTN-1 , TIA/CVA-2 ) for a CHADSVASc Score of >=5    Past Medical History:  Diagnosis Date   AV block, Mobitz 1    a. 12/2021 Zio: 56 pauses, longest of which 8.6 seconds.  Most occurred throughout periods of sleep or early morning hours.   Barrett esophagus    Chest pain    a. 12/2021 MV: EF 55-65%. No ischemia/infarct. Low risk study. CT imaging w/ Ao and Cor Ca2+.   Diastolic dysfunction    GERD (gastroesophageal reflux disease)    Hematuria    a. 12/2021 in setting of DAPT-->plavix d/c'd.   Hyperlipidemia LDL goal <70    Hypertension    Left carotid bruit    a. 12/2021 MRA neck: No hemodynamically significant carotid arterial stenoses.   Moderate aortic stenosis    a. 11/2021 Echo: EF 55-60%, no rwma, GRI DD, nl RV fxn, mildly  dil LA. Mod AS.   PAF (paroxysmal atrial fibrillation) (HCC)    a. 12/2021 Zio: Predominantly sinus rhythm at 62 bpm.  5 brief runs of nonsustained VT 56 pauses, longest of which lasted 8.6 seconds.  Mobitz 1 heart block noted.  Brief episode of atrial fibrillation-->Eliquis started.   Sinus bradycardia    TIA (transient ischemic attack)       Surgical History:  Past Surgical History:  Procedure Laterality Date   APPENDECTOMY     COLONOSCOPY  08/08/03, 12/05/08 & 12/14/13   ESOPHAGOGASTRODUODENOSCOPY  10/09/09, 12/25/09 & 03/24/12   ESOPHAGOGASTRODUODENOSCOPY (EGD) WITH PROPOFOL N/A 07/17/2017   Procedure: ESOPHAGOGASTRODUODENOSCOPY (EGD) WITH PROPOFOL;  Surgeon: Christena Deem, MD;  Location: [facility] ENDOSCOPY;  Service: Endoscopy;  Laterality: N/A;   TONSILLECTOMY       Home Meds: Current Meds  Medication Sig   amLODipine (NORVASC) 10 MG tablet Take 1 tablet (10 mg total) by mouth daily.   apixaban (ELIQUIS) 5 MG TABS tablet Take 1 tablet (5 mg total) by mouth 2 (two) times daily.   atorvastatin (LIPITOR) 40 MG tablet Take 1 tablet (40 mg total) by mouth daily.   cetirizine (ZYRTEC) 10 MG tablet Take 10 mg by mouth daily.   citalopram (CELEXA) 10 MG  tablet Take 5 mg by mouth daily.   lisinopril (ZESTRIL) 20 MG tablet Take 20 mg by mouth daily.   Multiple Vitamin (MULTI-VITAMIN) tablet Take 1 tablet by mouth daily.   omeprazole (PRILOSEC) 20 MG capsule Take 20 mg by mouth daily.   polyethylene glycol (MIRALAX / GLYCOLAX) 17 g packet Take 17 g by mouth daily.    Allergies: No Known Allergies  Social History   Socioeconomic History   Marital status: Married    Spouse name: Not on file   Number of children: Not on file   Years of education: Not on file   Highest education level: Not on file  Occupational History   Not on file  Tobacco Use   Smoking status: Former    Types: Pipe, Cigars    Quit date: 30    Years since quitting: 58.7   Smokeless tobacco: Never  Vaping Use    Vaping Use: Never used  Substance and Sexual Activity   Alcohol use: No   Drug use: No   Sexual activity: Not on file  Other Topics Concern   Not on file  Social History Narrative   Not on file   Social Determinants of Health   Financial Resource Strain: Not on file  Food Insecurity: Not on file  Transportation Needs: Not on file  Physical Activity: Not on file  Stress: Not on file  Social Connections: Not on file  Intimate Partner Violence: Not on file     Family History  Problem Relation Age of Onset   Diabetes Father      ROS:  Please see the history of present illness.     All other systems reviewed and negative.    Physical Exam: Blood pressure (!) 140/60, pulse (!) 58, height 5\' 6"  (1.676 m), weight 180 lb 8 oz (81.9 kg), SpO2 98 %. General: Well developed, well nourished male in no acute distress. Head: Normocephalic, atraumatic, sclera non-icteric, no xanthomas, nares are without discharge. EENT: normal  Lymph Nodes:  none Neck: Negative for carotid bruits. JVD not elevated. Back:without scoliosis kyphosis Lungs: Clear bilaterally to auscultation without wheezes, rales, or rhonchi. Breathing is unlabored. Heart: RRR with S1 S2. No  murmur . No rubs, or gallops appreciated. Abdomen: Soft, non-tender, non-distended with normoactive bowel sounds. No hepatomegaly. No rebound/guarding. No obvious abdominal masses. Msk:  Strength and tone appear normal for age. Extremities: No clubbing or cyanosis. No edema.  Distal pedal pulses are 2+ and equal bilaterally. Skin: Warm and Dry Neuro: Alert and oriented X 3. CN III-XII intact Grossly normal sensory and motor function . Psych:  Responds to questions appropriately with a normal affect.        EKG: Sinus at 58 Intervals 34/08/40 Frequent PACs   Assessment and Plan:  First-degree AV block and by report second-degree AV block type I with PR interval as measured from 300 600 ms  Nocturnal pauses some assoc with PP  prolongation and concomitant complete heart block   TIA  GI symptoms on Apixaban  ? Drug effect   Barretts esophagus/GERD  AS mod  HTN  Sinus Bradycardia  He has been given a diagnosis of AFib which I can not confirm with the tracings.  They are hard for me to interpret because of 1) LONG AV delay ranging from 300-600  msec, 2) low amplitude 3) freq PACs.  He was started on anticoagulation in light of the TIA; I have suggested that perhaps a loop monitor-implantable would allow 05[birth year]1992 to  confirm the presence of Afib and thus a need for anticoagulationn and to justify the attendant risks.    He also is having bowel issues which are infrequently described with Apixaban.  For now we have agreed to continue the anticoagulation, he aware of the bleeding risk as we try to determine the presence of atrial fibrillation.  I will need to reach out to industry to   No clear exertional symptoms related to his conduction system disease or his relative bradycardia.    With sleep disordered breathing will need to assess for possible sleep apnea. ( The times correlate with daytime nodding off)   This may be contributing also to HTN     Sherryl Manges

## 2022-02-15 NOTE — Telephone Encounter (Signed)
Attempted to call the patient at the number given. No answer- no voice mail set up.  Will call back at a later time.

## 2022-02-15 NOTE — Patient Instructions (Signed)
Medication Instructions:  - Your physician recommends that you continue on your current medications as directed. Please refer to the Current Medication list given to you today.  *If you need a refill on your cardiac medications before your next appointment, please call your pharmacy*   Lab Work: - none ordered  If you have labs (blood work) drawn today and your tests are completely normal, you will receive your results only by: MyChart Message (if you have MyChart) OR A paper copy in the mail If you have any lab test that is abnormal or we need to change your treatment, we will call you to review the results.   Testing/Procedures: - Your physician has recommended that you have an Implantable Loop Recorder (LINQ device) placed.   1) Please shower the night before and morning of your procedure with an antibacterial soap (any brand). 2) You may eat a light breakfast/ lunch prior to coming in. 3) You may take all of your regular medications as normal the day of your procedure 4) You may drive yourself home from the procedure 5) If you have someone with you, they will be asked to step out of the room during the implant   Follow-Up: At [facility], you and your health needs are our priority.  As part of our continuing mission to provide you with exceptional heart care, we have created designated Provider Care Teams.  These Care Teams include your primary Cardiologist (physician) and Advanced Practice Providers (APPs -  Physician Assistants and Nurse Practitioners) who all work together to provide you with the care you need, when you need it.  We recommend signing up for the patient portal called "MyChart".  Sign up information is provided on this After Visit Summary.  MyChart is used to connect with patients for Virtual Visits (Telemedicine).  Patients are able to view lab/test results, encounter notes, upcoming appointments, etc.  Non-urgent messages can be sent to your provider as  well.   To learn more about what you can do with MyChart, go to [email or web address].    Your next appointment:   We will contact you about a date for your loop recorder implant   The format for your next appointment:   In Person  Provider:   Sherryl Manges, MD    Other Instructions N/a  Important Information About Sugar

## 2022-02-18 ENCOUNTER — Telehealth: Payer: Self-pay | Admitting: Cardiovascular Disease

## 2022-02-18 NOTE — Telephone Encounter (Signed)
To Pharm D to please review.

## 2022-02-18 NOTE — Telephone Encounter (Signed)
Pt c/o medication issue:  1. Name of Medication: apixaban (ELIQUIS) 5 MG TABS tablet  2. How are you currently taking this medication (dosage and times per day)? As prescribed   3. Are you having a reaction (difficulty breathing--STAT)? Yes  4. What is your medication issue? Pt says that he is having issues with his stool since starting this medication. He says that has loose stools. He isn't completely sure this medication is causing it, but would like a call back to discuss this.

## 2022-02-19 NOTE — Telephone Encounter (Signed)
I spoke with the patient and advised him of Pharm D response and recommendations as stated below.  Per the patient, he feels his symptoms of loose stools started at the time he started Eliquis and citalopram. He stopped citalopram briefly and noticed no change in symptoms. He held Eliquis yesterday and this morning and had a fairly normal BM and less bloating today.   Per the patient, he resumed ASA 81 mg QD and is taking his cod liver oil, both of which he states he has taken "for years."  I advised the patient that in the context of the possible atrial fibrillation he had on his ZIO montior, and previous TIA symptoms, I will need to review with Dr. Graciela Husbands to see what his thoughts are on having him continue to hold the drug.   He is aware Dr. Graciela Husbands is out of the office and I will have to try to reach him by phone to discuss. I have asked him to please continue his ASA 81 mg once daily at this time and as soon as I can get recommendations from Dr. Graciela Husbands, I will call him back.   He is agreeable to still proceeding with his ILR implant. I have scheduled him to have this done on Thursday 03/14/22 at 11:20 am in the [place] office.   The patient voices understanding of all of the above and is agreeable. He was very appreciative of the call back.

## 2022-02-19 NOTE — Telephone Encounter (Signed)
Unlikely to be caused by Eliquis. Recommend supportive care and follow with PCP

## 2022-02-19 NOTE — Telephone Encounter (Signed)
I spoke with the patient and advised him that his heart monitor report is scanned in his chart and available for viewing.

## 2022-02-21 ENCOUNTER — Encounter: Payer: Self-pay | Admitting: *Deleted

## 2022-02-21 NOTE — Telephone Encounter (Signed)
Pt is returning call.  

## 2022-02-21 NOTE — Telephone Encounter (Signed)
I reached out to Dr. Graciela Husbands by phone to follow up on further recommendations based on the patient's symptoms and message below.  Per Dr. Graciela Husbands, he advised he did no know what the right answer was in this case, but would prefer to have the patient try samples of Xarelto or Pradaxa to see what happens to his stool.  Attempted to call the patient to discuss Dr. Odessa Fleming recommendations. No answer- I left a message to please call back.

## 2022-02-21 NOTE — Telephone Encounter (Signed)
Clarified with Dr. [name] that the patient would need to be Xarelto 20 mg once daily due to Creatinine Clearance of 66.

## 2022-02-21 NOTE — Telephone Encounter (Signed)
I called and spoke with the patient. I inquired how his bowel issues have been over the last couple of days. Per the patient "I'm back to normal."  I have advised him of Dr. Odessa Fleming recommendations to: - START samples of Xarelto to see if bowel symptoms return of if he can tolerate this ok.  Per the patient, he is willing to try Xarelto. I have advise him he will be on Xarelto 20 mg once daily w/ supper. He will need to stop ASA when he starts Xarelto.  I will try to give him enough samples to make it to his loop recorder implant.  The patient is aware he can pick up these samples at anytime at our front desk between 8:00 am- 5:00 pm.  The patient voices understanding and is agreeable.   Samples Given: Xarelto 20 mg Lot: [id number] Exp: 03/2023 # 4 bottles

## 2022-02-27 NOTE — Telephone Encounter (Signed)
Attempted to call the patient. No answer- no voice mail.  I will attempt to reach the patient at a later time.

## 2022-02-27 NOTE — Telephone Encounter (Signed)
Patient called to report the Xarelto is working out for him.  Patient also stated he does not want to get another monitor at this time and he wanted to cancel the implant scheduled in October.

## 2022-02-28 NOTE — Telephone Encounter (Signed)
I spoke with the patient regarding cancelling his appointment for his ILR with Dr. Caryl Comes on 03/14/22. He advised "I just have had too much going since June, I need a break."  Per the patient, he is doing ok on Xarelto and wants to continue with this for now and cancel his 03/14/22 appointment with Dr. Caryl Comes.  He was agreeable to coming to his scheduled appointment with Ignacia Bayley, NP on 05/09/22 at 11:20 am. I advised the patient I could switch his appointment to Dr. Caryl Comes on the same date/ time in November. The patient voices understanding and is agreeable.  He was very appreciative of the call back.

## 2022-03-06 ENCOUNTER — Telehealth: Payer: Self-pay | Admitting: Cardiovascular Disease

## 2022-03-06 NOTE — Telephone Encounter (Signed)
Pt c/o medication issue:  1. Name of Medication: Xarelto   2. How are you currently taking this medication (dosage and times per day)?   3. Are you having a reaction (difficulty breathing--STAT)? Yes  4. What is your medication issue?  Pt is having issues with constipation. He states that he gets the urge, but when he goes to the bathroom, it goes away. He believes this is due to the medication. Please advise.  Pt states he was given samples of this to try.

## 2022-03-06 NOTE — Telephone Encounter (Signed)
Patient reports that since being on xarelto his bowel movements have improved but are still not regular. He states that he still will get the urge to have a bowel movement but then he is not able to go. He is able to relieve himself at times but reports that it is runny. He states that overall he is doing better but is still concerned. Would like to know if he should stay on xarelto or if there is something else that he should try. If he needs to stay on xarelto he will need a prescription sent in as he was only given samples.

## 2022-03-07 ENCOUNTER — Institutional Professional Consult (permissible substitution): Payer: Medicare Other | Admitting: Internal Medicine

## 2022-03-11 NOTE — Telephone Encounter (Signed)
C  I am glad you are [place] :)))) I think prob worth continue Rivaroxaban for now and have him followup with GI/PCP   if he would like to try another anticoagulation drug it would be pradaxa, but I think it will not be so clear-- we cand o that first if he wants but I would suggest a bowel protocol

## 2022-03-12 ENCOUNTER — Telehealth: Payer: Self-pay | Admitting: Cardiovascular Disease

## 2022-03-12 NOTE — Telephone Encounter (Signed)
Pt c/o medication issue:  1. Name of Medication: Xarelto   2. How are you currently taking this medication (dosage and times per day)? a  3. Are you having a reaction (difficulty breathing--STAT)? no  4. What is your medication issue? Patient stats if he is going to continue to be on the medication. Then he needs a prescription sent in for him.  His bowel movement is still not the same but it is better. Please advise

## 2022-03-12 NOTE — Telephone Encounter (Signed)
Called patient back about his message. Patient stated he has been taking Xarelto and needs a prescription for it. Patient was on eliquis but was having issues with his GI track, loose stool and not being able to go. Patient stated with the Xarelto it is better but not resolved. Encouraged patient to follow up with GI for stool issue. Will forward to Dr. Caryl Comes and his nurse to get okay to place order for Xarelto, since it is not on his list at this time. Patient would like to use [facility].

## 2022-03-13 MED ORDER — RIVAROXABAN 20 MG PO TABS: 20.0000 mg | ORAL_TABLET | Freq: Every day | ORAL | 3 refills | Status: DC

## 2022-03-13 NOTE — Telephone Encounter (Signed)
Please see phone encounter on 03/12/22.

## 2022-03-13 NOTE — Telephone Encounter (Signed)
I called the patient and advised him that his Xarelto 20 mg once daily dose has been sent to [facility].  The patient was very appreciative of the call.  He advised her is still having some bowel issues, but tolerable. I have advised him to please follow up with GI/ or his PCP regarding his GI issues.  The patient voices understanding of all of the above and is agreeable.

## 2022-03-14 ENCOUNTER — Ambulatory Visit: Payer: Medicare Other | Admitting: Internal Medicine

## 2022-04-29 ENCOUNTER — Encounter: Payer: Self-pay | Admitting: Cardiovascular Disease

## 2022-04-29 NOTE — Telephone Encounter (Signed)
Error

## 2022-04-30 ENCOUNTER — Ambulatory Visit: Payer: Medicare Other | Attending: Cardiovascular Disease | Admitting: Cardiovascular Disease

## 2022-04-30 ENCOUNTER — Encounter: Payer: Self-pay | Admitting: Cardiovascular Disease

## 2022-04-30 VITALS — BP 140/50 | HR 62 | Ht 70.0 in | Wt 181.5 lb

## 2022-04-30 DIAGNOSIS — I350 Nonrheumatic aortic (valve) stenosis: Secondary | ICD-10-CM | POA: Diagnosis not present

## 2022-04-30 DIAGNOSIS — G459 Transient cerebral ischemic attack, unspecified: Secondary | ICD-10-CM | POA: Diagnosis not present

## 2022-04-30 DIAGNOSIS — R001 Bradycardia, unspecified: Secondary | ICD-10-CM

## 2022-04-30 DIAGNOSIS — I480 Paroxysmal atrial fibrillation: Secondary | ICD-10-CM

## 2022-04-30 DIAGNOSIS — E782 Mixed hyperlipidemia: Secondary | ICD-10-CM

## 2022-04-30 DIAGNOSIS — I10 Essential (primary) hypertension: Secondary | ICD-10-CM

## 2022-04-30 MED ORDER — DABIGATRAN ETEXILATE MESYLATE 150 MG PO CAPS: 150.0000 mg | ORAL_CAPSULE | Freq: Two times a day (BID) | ORAL | 6 refills | Status: DC

## 2022-04-30 NOTE — Patient Instructions (Addendum)
Medication Instructions:  Hold xarelto Start pradaxa 150 mg twice a day  If you need a refill on your cardiac medications before your next appointment, please call your pharmacy.   Lab work: No new labs needed  Testing/Procedures: No new testing needed  Follow-Up: At [facility], you and your health needs are our priority.  As part of our continuing mission to provide you with exceptional heart care, we have created designated Provider Care Teams.  These Care Teams include your primary Cardiologist (physician) and Advanced Practice Providers (APPs -  Physician Assistants and Nurse Practitioners) who all work together to provide you with the care you need, when you need it.  You will need a follow up appointment in 6 months  Providers on your designated Care Team:   Nicolasa Ducking, NP Eula Listen, PA-C Cadence Fransico Michael, [place]  COVID-19 Vaccine Information can be found at: [email or web address] For questions related to vaccine distribution or appointments, please email [email or web address] or call [phone].

## 2022-04-30 NOTE — Progress Notes (Signed)
Cardiology Office Note  Date:  04/30/2022   ID:  Carlos Frey, DOB 1929-10-08, MRN [id number]  PCP:  [name], MD   Chief Complaint  Patient presents with   Medication Problem    Patient c/o GI/issues due to the Xarelto. Medications reviewed by the patient verbally.     HPI:  [name] is a 88 year old male with above past medical history hypertension,  hyperlipidemia,  diastolic dysfunction,  moderate aortic stenosis,  sinus bradycardia, Mobitz 1 heart block,  TIA,  GERD, and Barrett's esophagus.  Paroxysmal atrial fibrillationNear syncope 2019 in the setting of sinus bradycardia, felt to be dehydrated, outpatient diuretic held Who presents for routine follow-up for his atrial fibrillation, history of strokes  This is my first time seeing him in the office Extensive records reviewed as below: June 2023, he presented to the emergency department with confusion and anorexia.  bradycardic with presence of Mobitz 1 heart block.   echocardiogram showed EF of 55 to 60% with grade 1 diastolic dysfunction and moderate aortic stenosis.   MRI was performed and showed old lacunar infarct without acute findings.   seen by neurology and placed on aspirin and Plavix.    emergency department December 08 2021 with hematuria and Plavix was discontinued.   CT urogram was reassuring and he was cleared to resume Plavix but then had recurrent hematuria and this was ultimately discontinued.    On December 20, 2021, he was seen in the emergency department with left-sided chest discomfort and right-sided numbness, as well as difficulty finding his words.  EKG showed sinus bradycardia with first-degree AV block and intermittent Mobitz 1 heart block.  Labs were unremarkable.  Repeat MRI of the brain continue to show remote lacunar infarcts of the left external capsule with chronic microvascular ischemic changes.  He was discharged home and advised to follow-up with cardiology.  At office  follow-up on July 17, he reported intermittent and often prolonged episodes of focal, left lower chest aching without associated symptoms, lasting hours at a time and resolving after drinking something or ambulating.  He also reported that previously ordered ZIO monitor was too difficult to place and therefore he returned it.    Lexiscan Myoview in the setting of chest discomfort and this was low risk without ischemia or infarct.   CT imaging did show aortic and coronary atherosclerosis.  Zio monitor, which showed a brief episode of paroxysmal atrial fibrillation and he was placed on Eliquis.   5 brief runs of nonsustained VT and 56 pauses, the longest of which lasted 8.6 seconds.  Pauses were frequently occurring in the middle of the night or in the early morning hours.   Reports having GI distress on Eliquis, significant constipation had to change to Xarelto Now on Xarelto feels he is having continued GI problems frequent bowel movements Some loose and constipation, difficulty regulating, finds it very bothersome given frequency of bowel movements worse in the morning  Numbness in legs at times Denies significant shortness of breath or chest pain  EKG personally reviewed by myself on todays visit NSR rate with first-degree AV block, second-degree AV block type I  PMH:   has a past medical history of AV block, Mobitz 1, Barrett esophagus, Chest pain, Diastolic dysfunction, GERD (gastroesophageal reflux disease), Hematuria, Hyperlipidemia LDL goal <70, Hypertension, Left carotid bruit, Moderate aortic stenosis, PAF (paroxysmal atrial fibrillation) (HCC), Sinus bradycardia, and TIA (transient ischemic attack).  PSH:    Past Surgical History:  Procedure Laterality Date  APPENDECTOMY     COLONOSCOPY  08/08/03, 12/05/08 & 12/14/13   ESOPHAGOGASTRODUODENOSCOPY  10/09/09, 12/25/09 & 03/24/12   ESOPHAGOGASTRODUODENOSCOPY (EGD) WITH PROPOFOL N/A 07/17/2017   Procedure: ESOPHAGOGASTRODUODENOSCOPY (EGD)  WITH PROPOFOL;  Surgeon: Christena Deem, MD;  Location: [facility] ENDOSCOPY;  Service: Endoscopy;  Laterality: N/A;   TONSILLECTOMY      Current Outpatient Medications  Medication Sig Dispense Refill   amLODipine (NORVASC) 10 MG tablet Take 1 tablet (10 mg total) by mouth daily. 180 tablet 3   atorvastatin (LIPITOR) 40 MG tablet Take 1 tablet (40 mg total) by mouth daily. 30 tablet 0   cetirizine (ZYRTEC) 10 MG tablet Take 10 mg by mouth daily.     citalopram (CELEXA) 10 MG tablet Take 5 mg by mouth daily.     lisinopril (ZESTRIL) 20 MG tablet Take 20 mg by mouth daily.     omeprazole (PRILOSEC) 20 MG capsule Take 20 mg by mouth daily.     polyethylene glycol (MIRALAX / GLYCOLAX) 17 g packet Take 17 g by mouth daily.     rivaroxaban (XARELTO) 20 MG TABS tablet Take 1 tablet (20 mg total) by mouth daily with supper. 90 tablet 3   No current facility-administered medications for this visit.   Allergies:   Patient has no known allergies.   Social History:  The patient  reports that he quit smoking about 58 years ago. His smoking use included pipe and cigars. He has never used smokeless tobacco. He reports that he does not drink alcohol and does not use drugs.   Family History:   family history includes Diabetes in his father.    Review of Systems: Review of Systems  Constitutional: Negative.   HENT: Negative.    Respiratory: Negative.    Cardiovascular: Negative.   Gastrointestinal: Negative.   Musculoskeletal: Negative.   Neurological: Negative.   Psychiatric/Behavioral: Negative.    All other systems reviewed and are negative.    PHYSICAL EXAM: VS:  BP (!) 140/50 (BP Location: Left Arm, Patient Position: Sitting, Cuff Size: Normal)   Pulse 62   Ht 5\' 10"  (1.778 m)   Wt 181 lb 8 oz (82.3 kg)   SpO2 97%   BMI 26.04 kg/m  , BMI Body mass index is 26.04 kg/m. Constitutional:  oriented to person, place, and time. No distress.  HENT:  Head: Grossly normal Eyes:  no  discharge. No scleral icterus.  Neck: No JVD, no carotid bruits  Cardiovascular: Regular rate and rhythm, no murmurs appreciated Pulmonary/Chest: Clear to auscultation bilaterally, no wheezes or rails Abdominal: Soft.  no distension.  no tenderness.  Musculoskeletal: Normal range of motion Neurological:  normal muscle tone. Coordination normal. No atrophy Skin: Skin warm and dry Psychiatric: normal affect, pleasant  Recent Labs: 11/15/2021: TSH 1.861 12/08/2021: ALT 15 12/20/2021: Magnesium 1.9 02/06/2022: BUN 15; Creatinine, Ser 0.88; Hemoglobin 14.1; Platelets 180; Potassium 4.7; Sodium 138    Lipid Panel Lab Results  Component Value Date   CHOL 127 12/09/2021   HDL 28 (L) 12/09/2021   LDLCALC 81 12/09/2021   TRIG 90 12/09/2021      Wt Readings from Last 3 Encounters:  04/30/22 181 lb 8 oz (82.3 kg)  02/14/22 180 lb 8 oz (81.9 kg)  02/06/22 182 lb 12.8 oz (82.9 kg)     ASSESSMENT AND PLAN:  Problem List Items Addressed This Visit       Cardiology Problems   TIA (transient ischemic attack)   Essential hypertension   Other Visit  Diagnoses     Paroxysmal atrial fibrillation (HCC)    -  Primary   Sinus bradycardia       Moderate aortic stenosis       Mixed hyperlipidemia          Paroxysmal atrial fibrillation Maintaining normal sinus rhythm, not on rate controlling medication secondary to history of bradycardia He is concerned about side effects from Eliquis, Xarelto Recommend he try Pradaxa 150 mg twice daily and stop Xarelto Also discussed warfarin if he continues to have symptoms  History of TIA Stressed importance of staying on anticoagulation given history of paroxysmal atrial fibrillation  Moderate aortic valve stenosis Last evaluated June 2023 mean gradient of 20 Will need periodic echocardiogram  Essential hypertension Blood pressure is well controlled on today's visit. No changes made to the medications.  Hyperlipidemia Cholesterol is at goal on  the current lipid regimen. No changes to the medications were made.    Extensive review of records on today's visit  Total encounter time more than 40 minutes  Greater than 50% was spent in counseling and coordination of care with the patient    Signed, Dossie Arbour, M.D., Ph.D. [facility] [place], [place] [phone]

## 2022-05-06 ENCOUNTER — Ambulatory Visit: Payer: Medicare Other | Admitting: Medical

## 2022-05-09 ENCOUNTER — Ambulatory Visit: Payer: Medicare Other | Admitting: Internal Medicine

## 2022-05-09 ENCOUNTER — Ambulatory Visit: Payer: Medicare Other | Admitting: Nurse Practitioner

## 2022-05-17 ENCOUNTER — Telehealth: Payer: Self-pay | Admitting: Cardiovascular Disease

## 2022-05-17 DIAGNOSIS — I480 Paroxysmal atrial fibrillation: Secondary | ICD-10-CM

## 2022-05-17 MED ORDER — DABIGATRAN ETEXILATE MESYLATE 150 MG PO CAPS: 150.0000 mg | ORAL_CAPSULE | Freq: Two times a day (BID) | ORAL | 1 refills | Status: DC

## 2022-05-17 NOTE — Telephone Encounter (Signed)
Refill request

## 2022-05-17 NOTE — Telephone Encounter (Signed)
*  STAT* If patient is at the pharmacy, call can be transferred to refill team.   1. Which medications need to be refilled? (please list name of each medication and dose if known)   dabigatran (PRADAXA) 150 MG CAPS capsule    2. Which pharmacy/location (including street and city if local pharmacy) is medication to be sent to? [facility] - [place], [place] - [address]   3. Do they need a 30 day or 90 day supply? 90  

## 2022-05-17 NOTE — Telephone Encounter (Signed)
Pradaxa 150mg  refill request received. Pt is 88 years old, weight-82.3kg, Crea-0.88 on 02/06/2022, last seen by Dr. 02/08/2022 on 04/30/2022, Diagnosis-Afib, CrCl- 66.25 mL/min; Dose is appropriate based on dosing criteria. Will send in refill to requested pharmacy.

## 2022-05-17 NOTE — Telephone Encounter (Signed)
This encounter was created in error - please disregard.

## 2022-05-20 ENCOUNTER — Telehealth: Payer: Self-pay | Admitting: Cardiovascular Disease

## 2022-05-20 NOTE — Telephone Encounter (Signed)
Disp Refills Start End   dabigatran (PRADAXA) 150 MG CAPS capsule 180 capsule 1 05/17/2022    Sig - Route: Take 1 capsule (150 mg total) by mouth 2 (two) times daily. - Oral   Sent to pharmacy as: dabigatran (PRADAXA) 150 MG Cap capsule   E-Prescribing Status: Receipt confirmed by pharmacy (05/17/2022 10:41 AM EST)

## 2022-05-20 NOTE — Telephone Encounter (Signed)
*  STAT* If patient is at the pharmacy, call can be transferred to refill team.   1. Which medications need to be refilled? (please list name of each medication and dose if known)   dabigatran (PRADAXA) 150 MG CAPS capsule    2. Which pharmacy/location (including street and city if local pharmacy) is medication to be sent to? [facility] - [place], [place] - [address] [place]   3. Do they need a 30 day or 90 day supply? 90

## 2022-05-20 NOTE — Telephone Encounter (Signed)
See below. Please advise.  

## 2022-05-20 NOTE — Telephone Encounter (Signed)
Pt c/o medication issue:  1. Name of Medication:   dabigatran (PRADAXA) 150 MG CAPS capsule    2. How are you currently taking this medication (dosage and times per day)?   Take 1 capsule (150 mg total) by mouth 2 (two) times daily.    3. Are you having a reaction (difficulty breathing--STAT)? no  4. What is your medication issue? Patient states that the medication is working fine. Please advise

## 2022-05-28 ENCOUNTER — Telehealth: Payer: Self-pay | Admitting: Cardiovascular Disease

## 2022-05-28 MED ORDER — RIVAROXABAN 20 MG PO TABS: 20.0000 mg | ORAL_TABLET | Freq: Every day | ORAL | 3 refills | Status: DC

## 2022-05-28 NOTE — Telephone Encounter (Signed)
Prescription for Xarelto 90 days with refills sent to  [facility] [address] [place] [address]

## 2022-05-28 NOTE — Telephone Encounter (Signed)
Per Dr Windell Hummingbird notes, patient has had GI symptoms with all anticoagulants:  "Reports having GI distress on Eliquis, significant constipation had to change to Xarelto Now on Xarelto feels he is having continued GI problems frequent bowel movements"  Due to hx of TIA and A Fib, needs to be on anticoagulation.  Recommend he take whichever medication he feels he tolerated the best,

## 2022-05-28 NOTE — Telephone Encounter (Signed)
Pt updated with Pharm D recommendations and stated he feels he can tolerate Xarelto better.  Will forward to Pharm D for new prescription. Pt stated he would like a 90 day supply.

## 2022-05-28 NOTE — Telephone Encounter (Signed)
Pt c/o medication issue:  1. Name of Medication: dabigatran (PRADAXA) 150 MG CAPS capsule   2. How are you currently taking this medication (dosage and times per day)?   3. Are you having a reaction (difficulty breathing--STAT)?   4. What is your medication issue? Pt states that his stomach burns when he takes this medication. He states he wants to know if he should go back on Xarelto, please advise

## 2022-05-28 NOTE — Telephone Encounter (Signed)
Pt called stating for the past 3-4 days, he's been experiencing a burning sensation in his stomach that he believes is related to the pradaxa.   Pt stated he's tried to eat with medication, but it doesn't relive symptoms Pt denies dark or bright red stool. Pt questioning if he should switch back to xarelto.   Will forward to MD and Pharm D for recommendations

## 2022-05-29 ENCOUNTER — Telehealth: Payer: Self-pay | Admitting: Cardiovascular Disease

## 2022-05-29 NOTE — Telephone Encounter (Signed)
Patient is calling about medication rivaroxaban (XARELTO) 20 MG TABS tablet. Please call back to discuss

## 2022-05-30 ENCOUNTER — Telehealth: Payer: Self-pay | Admitting: Cardiovascular Disease

## 2022-05-30 MED ORDER — RIVAROXABAN 20 MG PO TABS: 20.0000 mg | ORAL_TABLET | Freq: Every day | ORAL | 3 refills | Status: DC

## 2022-05-30 NOTE — Telephone Encounter (Signed)
Spoke with patient and reviewed provider approval for him to stay on the xarelto 20 mg once daily. He was appreciative for the call back with no further questions at this time.    Antonieta Iba, MD  Bryna Colander, RN Caller: Unspecified (Today, 11:29 AM) Molli Knock to switch back to Xarelto 20 if that is what he would prefer Thx TG

## 2022-05-30 NOTE — Telephone Encounter (Addendum)
Patient stated that he was taking Pradaxa and it caused burning in his stomach. Reports that when when he saw Dr. [name] that he was told he could take either one. According to his medication list it has Xarelto listed and I do not see Pradaxa on the list. Patient states that he switched back to xarelto himself and he tolerates that better. Advised that I would check with Dr. [name] and would give him a call back. He was appreciative with no further questions at this time.

## 2022-05-30 NOTE — Telephone Encounter (Signed)
Pharmacist switched pt back to xarelto on 12/19 and sent refills to [facility].  Pt is requesting to have refills sent to [facility].  Refills sent as requested with same quantity and refills as previously prescribed by pharmacist.

## 2022-05-30 NOTE — Telephone Encounter (Signed)
New Message:   Reference#  [id number]  Pt c/o medication issue:  1. Name of Medication: Pradaxa and Xarelto  2. How are you currently taking this medication (dosage and times per day)?   3. Are you having a reaction (difficulty breathing--STAT)?   4. What is your medication issue? which medicine is patient supposed to be taking?

## 2022-05-30 NOTE — Telephone Encounter (Signed)
Pt c/o medication issue:  1. Name of Medication: rivaroxaban (XARELTO) 20 MG TABS tablet   2. How are you currently taking this medication (dosage and times per day)?   3. Are you having a reaction (difficulty breathing--STAT)?   4. What is your medication issue? Pt states he [facility] and this medication is on hold. Please advise [phone]

## 2022-05-31 NOTE — Telephone Encounter (Signed)
Call to [facility] as per [facility].  Spoke with Mindi Junker, provided instruction patient is taking Xeralto 20 mg qd at supper.  Please discontinue Pradaxa. Jim Like MHA RN CCM

## 2022-05-31 NOTE — Telephone Encounter (Signed)
Received fax at [facility] office - [facility] is seeing both Pradaxa and Xarelto rx on file. They will need to be called and advised that pt is going to take just Xarelto and not Pradaxa.

## 2022-08-09 ENCOUNTER — Emergency Department: Payer: Medicare Other

## 2022-08-09 ENCOUNTER — Observation Stay
Admission: EM | Admit: 2022-08-09 | Discharge: 2022-08-11 | Disposition: A | Discharge status: Home / Self Care | Payer: Medicare Other | Attending: Internal Medicine | Admitting: Internal Medicine

## 2022-08-09 ENCOUNTER — Encounter: Payer: Self-pay | Admitting: Emergency Medicine

## 2022-08-09 ENCOUNTER — Other Ambulatory Visit: Payer: Self-pay

## 2022-08-09 DIAGNOSIS — E876 Hypokalemia: Secondary | ICD-10-CM | POA: Diagnosis not present

## 2022-08-09 DIAGNOSIS — Z8673 Personal history of transient ischemic attack (TIA), and cerebral infarction without residual deficits: Secondary | ICD-10-CM | POA: Insufficient documentation

## 2022-08-09 DIAGNOSIS — B9620 Unspecified Escherichia coli [E. coli] as the cause of diseases classified elsewhere: Secondary | ICD-10-CM | POA: Insufficient documentation

## 2022-08-09 DIAGNOSIS — I10 Essential (primary) hypertension: Secondary | ICD-10-CM | POA: Diagnosis not present

## 2022-08-09 DIAGNOSIS — R4182 Altered mental status, unspecified: Secondary | ICD-10-CM | POA: Diagnosis present

## 2022-08-09 DIAGNOSIS — Z87891 Personal history of nicotine dependence: Secondary | ICD-10-CM | POA: Insufficient documentation

## 2022-08-09 DIAGNOSIS — A044 Other intestinal Escherichia coli infections: Secondary | ICD-10-CM | POA: Diagnosis present

## 2022-08-09 DIAGNOSIS — Z79899 Other long term (current) drug therapy: Secondary | ICD-10-CM | POA: Diagnosis not present

## 2022-08-09 DIAGNOSIS — I480 Paroxysmal atrial fibrillation: Secondary | ICD-10-CM | POA: Diagnosis not present

## 2022-08-09 DIAGNOSIS — G9341 Metabolic encephalopathy: Secondary | ICD-10-CM | POA: Diagnosis not present

## 2022-08-09 DIAGNOSIS — N3000 Acute cystitis without hematuria: Secondary | ICD-10-CM | POA: Diagnosis not present

## 2022-08-09 DIAGNOSIS — G9340 Encephalopathy, unspecified: Secondary | ICD-10-CM | POA: Diagnosis not present

## 2022-08-09 DIAGNOSIS — R350 Frequency of micturition: Secondary | ICD-10-CM | POA: Insufficient documentation

## 2022-08-09 DIAGNOSIS — Z7901 Long term (current) use of anticoagulants: Secondary | ICD-10-CM | POA: Insufficient documentation

## 2022-08-09 DIAGNOSIS — N3090 Cystitis, unspecified without hematuria: Secondary | ICD-10-CM

## 2022-08-09 DIAGNOSIS — Z1152 Encounter for screening for COVID-19: Secondary | ICD-10-CM | POA: Insufficient documentation

## 2022-08-09 DIAGNOSIS — I441 Atrioventricular block, second degree: Secondary | ICD-10-CM

## 2022-08-09 DIAGNOSIS — R197 Diarrhea, unspecified: Secondary | ICD-10-CM | POA: Insufficient documentation

## 2022-08-09 DIAGNOSIS — N3001 Acute cystitis with hematuria: Secondary | ICD-10-CM

## 2022-08-09 DIAGNOSIS — R410 Disorientation, unspecified: Secondary | ICD-10-CM | POA: Insufficient documentation

## 2022-08-09 LAB — DIFFERENTIAL
Abs Immature Granulocytes: 0.02 10*3/uL (ref 0.00–0.07)
Basophils Absolute: 0 10*3/uL (ref 0.0–0.1)
Basophils Relative: 0 %
Eosinophils Absolute: 0 10*3/uL (ref 0.0–0.5)
Eosinophils Relative: 0 %
Immature Granulocytes: 0 %
Lymphocytes Relative: 17 %
Lymphs Abs: 1.3 10*3/uL (ref 0.7–4.0)
Monocytes Absolute: 0.7 10*3/uL (ref 0.1–1.0)
Monocytes Relative: 9 %
Neutro Abs: 5.5 10*3/uL (ref 1.7–7.7)
Neutrophils Relative %: 74 %

## 2022-08-09 LAB — CBC
HCT: 43.1 % (ref 39.0–52.0)
Hemoglobin: 14.6 g/dL (ref 13.0–17.0)
MCH: 33.3 pg (ref 26.0–34.0)
MCHC: 33.9 g/dL (ref 30.0–36.0)
MCV: 98.2 fL (ref 80.0–100.0)
Platelets: 163 10*3/uL (ref 150–400)
RBC: 4.39 MIL/uL (ref 4.22–5.81)
RDW: 12.9 % (ref 11.5–15.5)
WBC: 7.5 10*3/uL (ref 4.0–10.5)
nRBC: 0 % (ref 0.0–0.2)

## 2022-08-09 LAB — RESP PANEL BY RT-PCR (RSV, FLU A&B, COVID)  RVPGX2
Influenza A by PCR: NEGATIVE
Influenza B by PCR: NEGATIVE
Resp Syncytial Virus by PCR: NEGATIVE
SARS Coronavirus 2 by RT PCR: NEGATIVE

## 2022-08-09 LAB — URINALYSIS, ROUTINE W REFLEX MICROSCOPIC
Bacteria, UA: NONE SEEN
Bilirubin Urine: NEGATIVE
Glucose, UA: NEGATIVE mg/dL
Ketones, ur: 5 mg/dL — AB
Nitrite: NEGATIVE
Protein, ur: 100 mg/dL — AB
Specific Gravity, Urine: 1.017 (ref 1.005–1.030)
pH: 5 (ref 5.0–8.0)

## 2022-08-09 LAB — COMPREHENSIVE METABOLIC PANEL
ALT: 17 U/L (ref 0–44)
AST: 32 U/L (ref 15–41)
Albumin: 4.4 g/dL (ref 3.5–5.0)
Alkaline Phosphatase: 85 U/L (ref 38–126)
Anion gap: 14 (ref 5–15)
BUN: 19 mg/dL (ref 8–23)
CO2: 18 mmol/L — ABNORMAL LOW (ref 22–32)
Calcium: 9.5 mg/dL (ref 8.9–10.3)
Chloride: 105 mmol/L (ref 98–111)
Creatinine, Ser: 0.9 mg/dL (ref 0.61–1.24)
GFR, Estimated: >60 mL/min (ref >60)
Glucose, Bld: 121 mg/dL — ABNORMAL HIGH (ref 70–99)
Potassium: 3.7 mmol/L (ref 3.5–5.1)
Sodium: 137 mmol/L (ref 135–145)
Total Bilirubin: 1.5 mg/dL — ABNORMAL HIGH (ref 0.3–1.2)
Total Protein: 7.7 g/dL (ref 6.5–8.1)

## 2022-08-09 LAB — ETHANOL: Alcohol, Ethyl (B): <10 mg/dL (ref <10)

## 2022-08-09 LAB — PROTIME-INR
INR: 1.4 — ABNORMAL HIGH (ref 0.8–1.2)
Prothrombin Time: 17.1 seconds — ABNORMAL HIGH (ref 11.4–15.2)

## 2022-08-09 LAB — APTT: aPTT: 45 seconds — ABNORMAL HIGH (ref 24–36)

## 2022-08-09 LAB — CBG MONITORING, ED: Glucose-Capillary: 117 mg/dL — ABNORMAL HIGH (ref 70–99)

## 2022-08-09 MED ORDER — AMLODIPINE BESYLATE 10 MG PO TABS
10.0000 mg | ORAL_TABLET | Freq: Every day | ORAL | Status: DC
  Administered 2022-08-10 – 2022-08-11 (×2): 10 mg via ORAL
  Filled 2022-08-09 (×2): qty 1

## 2022-08-09 MED ORDER — LACTATED RINGERS IV SOLN: INTRAVENOUS | Status: DC

## 2022-08-09 MED ORDER — LORATADINE 10 MG PO TABS
10.0000 mg | ORAL_TABLET | Freq: Every day | ORAL | Status: DC
  Administered 2022-08-10 – 2022-08-11 (×2): 10 mg via ORAL
  Filled 2022-08-09 (×2): qty 1

## 2022-08-09 MED ORDER — SODIUM CHLORIDE 0.9 % IV SOLN
1.0000 g | INTRAVENOUS | Status: DC
  Administered 2022-08-10 – 2022-08-11 (×2): 1 g via INTRAVENOUS
  Filled 2022-08-09 (×2): qty 1

## 2022-08-09 MED ORDER — SODIUM CHLORIDE 0.9 % IV SOLN
1.0000 g | Freq: Once | INTRAVENOUS | Status: AC
  Administered 2022-08-09: 1 g via INTRAVENOUS
  Filled 2022-08-09: qty 10

## 2022-08-09 MED ORDER — ONDANSETRON HCL 4 MG PO TABS: 4.0000 mg | ORAL_TABLET | Freq: Four times a day (QID) | ORAL | Status: DC | PRN

## 2022-08-09 MED ORDER — ONDANSETRON HCL 4 MG/2ML IJ SOLN: 4.0000 mg | Freq: Four times a day (QID) | INTRAMUSCULAR | Status: DC | PRN

## 2022-08-09 MED ORDER — SODIUM CHLORIDE 0.9% FLUSH: 3.0000 mL | Freq: Once | INTRAVENOUS | Status: DC

## 2022-08-09 MED ORDER — CITALOPRAM HYDROBROMIDE 20 MG PO TABS: 10.0000 mg | ORAL_TABLET | Freq: Every day | ORAL | Status: DC

## 2022-08-09 MED ORDER — ACETAMINOPHEN 325 MG PO TABS: 650.0000 mg | ORAL_TABLET | Freq: Four times a day (QID) | ORAL | Status: DC | PRN

## 2022-08-09 MED ORDER — ACETAMINOPHEN 650 MG RE SUPP: 650.0000 mg | Freq: Four times a day (QID) | RECTAL | Status: DC | PRN

## 2022-08-09 MED ORDER — ATORVASTATIN CALCIUM 20 MG PO TABS
40.0000 mg | ORAL_TABLET | Freq: Every day | ORAL | Status: DC
  Administered 2022-08-10 – 2022-08-11 (×2): 40 mg via ORAL
  Filled 2022-08-09 (×2): qty 2

## 2022-08-09 MED ORDER — PANTOPRAZOLE SODIUM 40 MG PO TBEC
40.0000 mg | DELAYED_RELEASE_TABLET | Freq: Every day | ORAL | Status: DC
  Administered 2022-08-10 – 2022-08-11 (×2): 40 mg via ORAL
  Filled 2022-08-09 (×2): qty 1

## 2022-08-09 MED ORDER — RIVAROXABAN 20 MG PO TABS
20.0000 mg | ORAL_TABLET | Freq: Every day | ORAL | Status: DC
  Administered 2022-08-09 – 2022-08-10 (×2): 20 mg via ORAL
  Filled 2022-08-09 (×3): qty 1

## 2022-08-09 MED ORDER — SODIUM CHLORIDE 0.9 % IV BOLUS
1000.0000 mL | Freq: Once | INTRAVENOUS | Status: AC
  Administered 2022-08-09: 1000 mL via INTRAVENOUS

## 2022-08-09 MED ORDER — LISINOPRIL 20 MG PO TABS
20.0000 mg | ORAL_TABLET | Freq: Every day | ORAL | Status: DC
  Administered 2022-08-10 – 2022-08-11 (×2): 20 mg via ORAL
  Filled 2022-08-09 (×2): qty 1

## 2022-08-09 NOTE — Assessment & Plan Note (Signed)
Patient presenting with acute encephalopathy and urinalysis with leukocytes, elevated WBC, however no bacteria.  Given history of worsening urinary frequency and urgency, will cover with ceftriaxone until urine culture returns.  - Continue ceftriaxone 1 g daily - Urine culture pending

## 2022-08-09 NOTE — Assessment & Plan Note (Signed)
-   Continue home antihypertensives 

## 2022-08-09 NOTE — H&P (Addendum)
History and Physical    Patient: Carlos Frey [id number] DOB: [birth year] DOA: 08/09/2022 DOS: the patient was seen and examined on 08/09/2022 PCP: Derinda Late, MD  Patient coming from: Home  Chief Complaint:  Chief Complaint  Patient presents with   Altered Mental Status   HPI: Carlos Frey is a 89 y.o. male with medical history significant of paroxysmal atrial fibrillation on Xarelto, hypertension, hyperlipidemia, CVA, moderate aortic stenosis, who presents to the ED due to altered mental status.  History obtained from patient's wife at bedside, Carlos Frey due to patient's altered mental status.  Carlos Frey states that yesterday morning, patient began to experience worsening watery diarrhea and urinary frequency.  She states that he would urinate every 5 to 10 minutes.  In addition, he would frequently have watery diarrhea with every episode of urination.  She notes that he has a history of urinary frequency and diarrhea, but this is acutely worse.  Yesterday evening, she noted that he became confused and incoherent.  She endorses noticing chills but denies any fever, nausea, vomiting. She denies any melena or hematochezia.   ED Course:  On arrival to the ED, patient was hypertensive at 151/77 with HR of 100. He was saturating at 96% on room air with RR of 18. He was afebrile at 97.8.  Initial workup notable for WBC of 7.5, hemoglobin of 14.6, potassium 3.7, bicarb 18, anion gap of 14, creatinine 0.90, BUN 19, GFR above 60.  INR elevated 1.4 with PTT of 45.  Urinalysis demonstrates small hemoglobin, ketonuria, moderate leukocytes, and 21-50 WBC/hpf but no bacteria.  CT head with no acute intracranial abnormalities.  Patient started on ceftriaxone for concern of UTI.  [facility] contacted for admission.  Review of Systems: Unable to obtain full review of systems due to altered mental status  Past Medical History:  Diagnosis Date   AV block, Mobitz 1    a. 12/2021 Zio: 56 pauses,  longest of which 8.6 seconds.  Most occurred throughout periods of sleep or early morning hours.   Barrett esophagus    Chest pain    a. 12/2021 MV: EF 55-65%. No ischemia/infarct. Low risk study. CT imaging w/ Ao and Cor Ca2+.   Diastolic dysfunction    GERD (gastroesophageal reflux disease)    Hematuria    a. 12/2021 in setting of DAPT-->plavix d/c'd.   Hyperlipidemia LDL goal <70    Hypertension    Left carotid bruit    a. 12/2021 MRA neck: No hemodynamically significant carotid arterial stenoses.   Moderate aortic stenosis    a. 11/2021 Echo: EF 55-60%, no rwma, GRI DD, nl RV fxn, mildly dil LA. Mod AS.   PAF (paroxysmal atrial fibrillation) ([place])    a. 12/2021 Zio: Predominantly sinus rhythm at 62 bpm.  5 brief runs of nonsustained VT 56 pauses, longest of which lasted 8.6 seconds.  Mobitz 1 heart block noted.  Brief episode of atrial fibrillation-->Eliquis started.   Sinus bradycardia    TIA (transient ischemic attack)    Past Surgical History:  Procedure Laterality Date   APPENDECTOMY     COLONOSCOPY  08/08/03, 12/05/08 & 12/14/13   ESOPHAGOGASTRODUODENOSCOPY  10/09/09, 12/25/09 & 03/24/12   ESOPHAGOGASTRODUODENOSCOPY (EGD) WITH PROPOFOL N/A 07/17/2017   Procedure: ESOPHAGOGASTRODUODENOSCOPY (EGD) WITH PROPOFOL;  Surgeon: Lollie Sails, MD;  Location: [facility] ENDOSCOPY;  Service: Endoscopy;  Laterality: N/A;   TONSILLECTOMY     Social History:  reports that he quit smoking about 59 years ago. His smoking use  included pipe and cigars. He has never used smokeless tobacco. He reports that he does not drink alcohol and does not use drugs.  No Known Allergies  Family History  Problem Relation Age of Onset   Diabetes Father     Prior to Admission medications   Medication Sig Start Date End Date Taking? Authorizing Provider  amLODipine (NORVASC) 10 MG tablet Take 1 tablet (10 mg total) by mouth daily. 02/06/22 01/27/24  Theora Gianotti, NP  atorvastatin (LIPITOR) 40 MG tablet Take  1 tablet (40 mg total) by mouth daily. 11/15/21   Emeterio Reeve, DO  cetirizine (ZYRTEC) 10 MG tablet Take 10 mg by mouth daily.    [provider]  citalopram (CELEXA) 10 MG tablet Take 5 mg by mouth daily. 12/14/21 12/14/22  [provider]  lisinopril (ZESTRIL) 20 MG tablet Take 20 mg by mouth daily. 11/29/21   [provider]  omeprazole (PRILOSEC) 20 MG capsule Take 20 mg by mouth daily.    [provider]  polyethylene glycol (MIRALAX / GLYCOLAX) 17 g packet Take 17 g by mouth daily.    [provider]  rivaroxaban (XARELTO) 20 MG TABS tablet Take 1 tablet (20 mg total) by mouth daily with supper. 05/30/22   Minna Merritts, MD   Physical Exam: Vitals:   08/09/22 1300  BP: (!) 151/77  Pulse: 100  Resp: 18  Temp: 97.8 F (36.6 C)  TempSrc: Oral  SpO2: 96%   Physical Exam Vitals and nursing note reviewed.  Constitutional:      General: He is not in acute distress.    Appearance: He is normal weight. He is not toxic-appearing.  HENT:     Head: Normocephalic and atraumatic.     Mouth/Throat:     Mouth: Mucous membranes are dry.     Pharynx: Oropharynx is clear.  Eyes:     Conjunctiva/sclera: Conjunctivae normal.     Pupils: Pupils are equal, round, and reactive to light.  Cardiovascular:     Rate and Rhythm: Normal rate and regular rhythm.     Heart sounds: No murmur heard.    No gallop.  Pulmonary:     Effort: Pulmonary effort is normal. No respiratory distress.     Breath sounds: Normal breath sounds. No wheezing, rhonchi or rales.  Abdominal:     General: Bowel sounds are normal. There is no distension.     Palpations: Abdomen is soft.     Tenderness: There is no abdominal tenderness. There is no guarding.  Musculoskeletal:        General: No signs of injury.     Right lower leg: No edema.     Left lower leg: No edema.  Skin:    General: Skin is warm and dry.  Neurological:     Mental Status: He is alert.      Comments:  Alert and following commands, however unable to answer any orientation questions.  No facial asymmetry or dysarthria Moving all extremities independently.  Gait intact  Psychiatric:        Behavior: Behavior is cooperative.        Cognition and Memory: Cognition is impaired. Memory is impaired.    Data Reviewed: CBC with WBC of 7.5, hemoglobin of 14.6, platelets 163 CMP with sodium of 137, potassium 3.7, bicarb 18, anion gap 14, glucose 121, BUN 19, creatinine 0.90, AST 232, ALT 17 and GFR above 60 INR elevated at 1.4 PTT elevated at 45 Urinalysis with small  hemoglobin, 5 ketones, moderate leukocytes, proteinuria, 6-10 RBCs and 21-50 WBCs with no bacteria Alcohol level negative COVID-19, RSV and influenza PCR negative  Follow-up with SLP EKG personally reviewed.  Multiple PVCs and PACs make it difficult to discern underlying rhythm, however appears to be a junctional rhythm vs atrial fibrillation with regular ventricular rate.  No ST or T wave changes to suggest ischemia.  CT HEAD WO CONTRAST  Result Date: 08/09/2022 CLINICAL DATA:  Altered mental status.  Possible stroke. EXAM: CT HEAD WITHOUT CONTRAST TECHNIQUE: Contiguous axial images were obtained from the base of the skull through the vertex without intravenous contrast. RADIATION DOSE REDUCTION: This exam was performed according to the departmental dose-optimization program which includes automated exposure control, adjustment of the mA and/or kV according to patient size and/or use of iterative reconstruction technique. COMPARISON:  MRI brain dated December 20, 2021. CT head dated December 08, 2021. FINDINGS: Brain: No evidence of acute infarction, hemorrhage, hydrocephalus, extra-axial collection or mass lesion/mass effect. Stable atrophy and chronic microvascular ischemic changes. Vascular: Calcified atherosclerosis at the skull base. No hyperdense vessel. Skull: Normal. Negative for fracture or focal lesion. Sinuses/Orbits: No acute  finding. Other: None. IMPRESSION: 1. No acute intracranial abnormality. Electronically Signed   By: Titus Dubin M.D.   On: 08/09/2022 14:09    There are no new results to review at this time.  Assessment and Plan:  * Acute encephalopathy Patient presenting with disorientation and confusion, likely metabolic in the setting of underlying infection. No focal neurological deficits on examination to suggest CVA/TIA.   - Dysphagia diet until improvements in mental status - Management of diarrhea and UTI as noted below - IV fluids - Delirium precautions  Acute cystitis Patient presenting with acute encephalopathy and urinalysis with leukocytes, elevated WBC, however no bacteria.  Given history of worsening urinary frequency and urgency, will cover with ceftriaxone until urine culture returns.  - Continue ceftriaxone 1 g daily - Urine culture pending  Diarrhea Patient's wife states that he has been experiencing on and off diarrhea for several months now, however in the last 24 hours, it has been acutely severe compared to prior.  This is likely the explanation for patient's metabolic acidosis as well.  No recent antibiotic use.  Patient's wife feels that symptoms started after initiating Xarelto, however this isn't a typical reaction with Xarelto.  He did start Celexa around the same time, which is known to give diarrhea though.  Given acute worsening with evaluate for inflammatory and infectious etiology. Exam is non-focal with no pain, so no indication for imaging.   - GI panel, fecal lactoferrin, fecal calprotectin pending - IV fluids  PAF (paroxysmal atrial fibrillation) (HCC) Patient's EKG today has multiple PACs and PVCs making it difficult to discern the underlying rhythm, however likely atrial fibrillation versus junctional rhythm.  - Repeat EKG - Continue home Xarelto  Essential hypertension - Continue home antihypertensives  Advance Care Planning:   Code Status: Full Code  verified by patient's wife  Consults: None  Family Communication: Patient's wife updated at bedside  Severity of Illness: The appropriate patient status for this patient is OBSERVATION. Observation status is judged to be reasonable and necessary in order to provide the required intensity of service to ensure the patient's safety. The patient's presenting symptoms, physical exam findings, and initial radiographic and laboratory data in the context of their medical condition is felt to place them at decreased risk for further clinical deterioration. Furthermore, it is anticipated that the patient will  be medically stable for discharge from the hospital within 2 midnights of admission.   Author: Jose Persia, MD 08/09/2022 6:24 PM  For on call review [email or web address].

## 2022-08-09 NOTE — ED Triage Notes (Signed)
Patient to ED for AMS and possible stroke. Wife states he has had diarrhea x2 days. AMS first noticed this AM. [place] 9pm last night when he went to bed. Patient unable to answer orientation questions but is following commands. Strength equal and bilateral. Hx of stroke and does take blood thinners.

## 2022-08-09 NOTE — Assessment & Plan Note (Signed)
Patient presenting with disorientation and confusion, likely metabolic in the setting of underlying infection. No focal neurological deficits on examination to suggest CVA/TIA.   - Dysphagia diet until improvements in mental status - Management of diarrhea and UTI as noted below - IV fluids - Delirium precautions

## 2022-08-09 NOTE — Assessment & Plan Note (Addendum)
Patient's wife states that he has been experiencing on and off diarrhea for several months now, however in the last 24 hours, it has been acutely severe compared to prior.  This is likely the explanation for patient's metabolic acidosis as well.  No recent antibiotic use.  Patient's wife feels that symptoms started after initiating Xarelto, however this isn't a typical reaction with Xarelto.  He did start Celexa around the same time, which is known to give diarrhea though.  Given acute worsening with evaluate for inflammatory and infectious etiology. Exam is non-focal with no pain, so no indication for imaging.   - GI panel, fecal lactoferrin, fecal calprotectin pending - IV fluids

## 2022-08-09 NOTE — Assessment & Plan Note (Addendum)
Patient's EKG today has multiple PACs and PVCs making it difficult to discern the underlying rhythm, however likely atrial fibrillation versus junctional rhythm.  - Repeat EKG - Continue home Xarelto

## 2022-08-09 NOTE — ED Provider Notes (Signed)
[facility] Provider Note    Event Date/Time   First MD Initiated Contact with Patient 08/09/22 1329     (approximate)   History   Chief Complaint: Altered Mental Status   HPI  Carlos Frey is a 89 y.o. male with a history of hypertension GERD paroxysmal atrial fibrillation on Xarelto, TIA who is brought to the ED due to confusion that started yesterday evening around bedtime.  No falls or head trauma, no motor weakness.  Spouse notes that patient has been speaking incoherently.  Also having urinary frequency and diarrhea.     Physical Exam   Triage Vital Signs: ED Triage Vitals  Enc Vitals Group     BP 08/09/22 1300 (!) 151/77     Pulse Rate 08/09/22 1300 100     Resp 08/09/22 1300 18     Temp 08/09/22 1300 97.8 F (36.6 C)     Temp Source 08/09/22 1300 Oral     SpO2 08/09/22 1300 96 %     Weight --      Height --      Head Circumference --      Peak Flow --      Pain Score 08/09/22 1258 0     Pain Loc --      Pain Edu? --      Excl. in [place]? --     Most recent vital signs: Vitals:   08/09/22 1300  BP: (!) 151/77  Pulse: 100  Resp: 18  Temp: 97.8 F (36.6 C)  SpO2: 96%    General: Awake, no distress.  CV:  Good peripheral perfusion.  Regular rate and rhythm Resp:  Normal effort.  Clear to auscultation bilaterally Abd:  No distention.  Soft with suprapubic tenderness Other:  Oriented to self only.  No signs of head trauma.  No lower extremity edema.  Dry mucous membranes.   ED Results / Procedures / Treatments   Labs (all labs ordered are listed, but only abnormal results are displayed) Labs Reviewed  PROTIME-INR - Abnormal; Notable for the following components:      Result Value   Prothrombin Time 17.1 (*)    INR 1.4 (*)    All other components within normal limits  APTT - Abnormal; Notable for the following components:   aPTT 45 (*)    All other components within normal limits  COMPREHENSIVE METABOLIC PANEL -  Abnormal; Notable for the following components:   CO2 18 (*)    Glucose, Bld 121 (*)    Total Bilirubin 1.5 (*)    All other components within normal limits  URINALYSIS, ROUTINE W REFLEX MICROSCOPIC - Abnormal; Notable for the following components:   Color, Urine YELLOW (*)    APPearance HAZY (*)    Hgb urine dipstick SMALL (*)    Ketones, ur 5 (*)    Protein, ur 100 (*)    Leukocytes,Ua MODERATE (*)    All other components within normal limits  CBG MONITORING, ED - Abnormal; Notable for the following components:   Glucose-Capillary 117 (*)    All other components within normal limits  RESP PANEL BY RT-PCR (RSV, FLU A&B, COVID)  RVPGX2  CBC  DIFFERENTIAL  ETHANOL  I-STAT CREATININE, ED     EKG Interpreted by me Sinus rhythm, rate of 68.  Normal axis, first-degree AV block.  Poor R wave progression.  Normal ST segments and T waves.  3 PVCs on the strip.   RADIOLOGY CT head  interpreted by me, negative for mass or hemorrhage.  Radiology report reviewed   PROCEDURES:  Procedures   MEDICATIONS ORDERED IN ED: Medications  sodium chloride flush (NS) 0.9 % injection 3 mL (3 mLs Intravenous Not Given 08/09/22 1400)  sodium chloride 0.9 % bolus 1,000 mL (has no administration in time range)  cefTRIAXone (ROCEPHIN) 1 g in sodium chloride 0.9 % 100 mL IVPB (has no administration in time range)     IMPRESSION / MDM / ASSESSMENT AND PLAN / ED COURSE  I reviewed the triage vital signs and the nursing notes.  DDx: Viral illness, UTI, electrolyte abnormality, anemia, AKI, intracranial hemorrhage, intracranial mass  Patient's presentation is most consistent with acute presentation with potential threat to life or bodily function.  Patient presents with confusion, 2 days of diarrhea, urinary frequency.  Labs show UTI, COVID and flu negative, serum labs okay.  Discussed these findings with his spouse who notes that in his current condition with his acute functional decline, his care is  not manageable at home.  Will need to admit for acute encephalopathy in the setting of UTI.  IV Rocephin ordered.       FINAL CLINICAL IMPRESSION(S) / ED DIAGNOSES   Final diagnoses:  Cystitis  Acute metabolic encephalopathy     Rx / DC Orders   ED Discharge Orders     None        Note:  This document was prepared using Dragon voice recognition software and may include unintentional dictation errors.   Carrie Mew, MD 08/09/22 1520

## 2022-08-10 ENCOUNTER — Encounter: Payer: Self-pay | Admitting: Internal Medicine

## 2022-08-10 DIAGNOSIS — I480 Paroxysmal atrial fibrillation: Secondary | ICD-10-CM | POA: Diagnosis not present

## 2022-08-10 DIAGNOSIS — G9341 Metabolic encephalopathy: Secondary | ICD-10-CM

## 2022-08-10 DIAGNOSIS — N3001 Acute cystitis with hematuria: Secondary | ICD-10-CM | POA: Diagnosis not present

## 2022-08-10 DIAGNOSIS — I441 Atrioventricular block, second degree: Secondary | ICD-10-CM

## 2022-08-10 DIAGNOSIS — E876 Hypokalemia: Secondary | ICD-10-CM | POA: Diagnosis not present

## 2022-08-10 LAB — GASTROINTESTINAL PANEL BY PCR, STOOL (REPLACES STOOL CULTURE)
Adenovirus F40/41: DETECTED — AB
Astrovirus: NOT DETECTED
Campylobacter species: NOT DETECTED
Cryptosporidium: NOT DETECTED
Cyclospora cayetanensis: NOT DETECTED
Entamoeba histolytica: NOT DETECTED
Enteroaggregative E coli (EAEC): NOT DETECTED
Enteropathogenic E coli (EPEC): DETECTED — AB
Enterotoxigenic E coli (ETEC): NOT DETECTED
Giardia lamblia: NOT DETECTED
Norovirus GI/GII: NOT DETECTED
Plesimonas shigelloides: NOT DETECTED
Rotavirus A: NOT DETECTED
Salmonella species: NOT DETECTED
Sapovirus (I, II, IV, and V): NOT DETECTED
Shiga like toxin producing E coli (STEC): NOT DETECTED
Shigella/Enteroinvasive E coli (EIEC): NOT DETECTED
Vibrio cholerae: NOT DETECTED
Vibrio species: NOT DETECTED
Yersinia enterocolitica: NOT DETECTED

## 2022-08-10 LAB — CBC
HCT: 34.9 % — ABNORMAL LOW (ref 39.0–52.0)
Hemoglobin: 12.1 g/dL — ABNORMAL LOW (ref 13.0–17.0)
MCH: 33.6 pg (ref 26.0–34.0)
MCHC: 34.7 g/dL (ref 30.0–36.0)
MCV: 96.9 fL (ref 80.0–100.0)
Platelets: 134 10*3/uL — ABNORMAL LOW (ref 150–400)
RBC: 3.6 MIL/uL — ABNORMAL LOW (ref 4.22–5.81)
RDW: 13 % (ref 11.5–15.5)
WBC: 5.2 10*3/uL (ref 4.0–10.5)
nRBC: 0 % (ref 0.0–0.2)

## 2022-08-10 LAB — BASIC METABOLIC PANEL
Anion gap: 8 (ref 5–15)
BUN: 14 mg/dL (ref 8–23)
CO2: 22 mmol/L (ref 22–32)
Calcium: 8.4 mg/dL — ABNORMAL LOW (ref 8.9–10.3)
Chloride: 105 mmol/L (ref 98–111)
Creatinine, Ser: 0.76 mg/dL (ref 0.61–1.24)
GFR, Estimated: >60 mL/min (ref >60)
Glucose, Bld: 87 mg/dL (ref 70–99)
Potassium: 3.4 mmol/L — ABNORMAL LOW (ref 3.5–5.1)
Sodium: 135 mmol/L (ref 135–145)

## 2022-08-10 LAB — C DIFFICILE QUICK SCREEN W PCR REFLEX
C Diff antigen: NEGATIVE
C Diff interpretation: NOT DETECTED
C Diff toxin: NEGATIVE

## 2022-08-10 LAB — LACTOFERRIN, FECAL, QUALITATIVE: Lactoferrin, Fecal, Qual: POSITIVE — AB

## 2022-08-10 MED ORDER — POTASSIUM CHLORIDE 20 MEQ PO PACK
40.0000 meq | PACK | Freq: Once | ORAL | Status: AC
  Administered 2022-08-10: 40 meq via ORAL
  Filled 2022-08-10: qty 2

## 2022-08-10 MED ORDER — SODIUM CHLORIDE 0.9 % IV SOLN: INTRAVENOUS | Status: DC | PRN

## 2022-08-10 MED ORDER — LOPERAMIDE HCL 2 MG PO CAPS
2.0000 mg | ORAL_CAPSULE | Freq: Four times a day (QID) | ORAL | Status: DC | PRN
  Administered 2022-08-10: 2 mg via ORAL
  Filled 2022-08-10: qty 1

## 2022-08-10 NOTE — Plan of Care (Signed)
  Problem: Education: Goal: Knowledge of General Education information will improve Description: Including pain rating scale, medication(s)/side effects and non-pharmacologic comfort measures Outcome: Progressing   Problem: Health Behavior/Discharge Planning: Goal: Ability to manage health-related needs will improve Outcome: Progressing   Problem: Clinical Measurements: Goal: Ability to maintain clinical measurements within normal limits will improve Outcome: Progressing Goal: Will remain free from infection Outcome: Progressing Goal: Diagnostic test results will improve Outcome: Progressing Goal: Respiratory complications will improve Outcome: Progressing Goal: Cardiovascular complication will be avoided Outcome: Progressing   Problem: Activity: Goal: Risk for activity intolerance will decrease Outcome: Progressing   Problem: Activity: Goal: Risk for activity intolerance will decrease Outcome: Progressing   Problem: Nutrition: Goal: Adequate nutrition will be maintained Outcome: Progressing   Problem: Coping: Goal: Level of anxiety will decrease Outcome: Progressing   Problem: Elimination: Goal: Will not experience complications related to bowel motility Outcome: Progressing Goal: Will not experience complications related to urinary retention Outcome: Progressing

## 2022-08-10 NOTE — Plan of Care (Signed)
  Problem: Clinical Measurements: Goal: Ability to maintain clinical measurements within normal limits will improve Outcome: Progressing Goal: Will remain free from infection Outcome: Progressing Goal: Diagnostic test results will improve Outcome: Progressing Goal: Respiratory complications will improve Outcome: Progressing Goal: Cardiovascular complication will be avoided Outcome: Progressing   Problem: Activity: Goal: Risk for activity intolerance will decrease Outcome: Progressing   Problem: Nutrition: Goal: Adequate nutrition will be maintained Outcome: Progressing   Problem: Coping: Goal: Level of anxiety will decrease Outcome: Progressing   Problem: Elimination: Goal: Will not experience complications related to bowel motility Outcome: Progressing Goal: Will not experience complications related to urinary retention Outcome: Progressing   Problem: Pain Managment: Goal: General experience of comfort will improve Outcome: Progressing   Problem: Safety: Goal: Ability to remain free from injury will improve Outcome: Progressing   Problem: Skin Integrity: Goal: Risk for impaired skin integrity will decrease Outcome: Progressing   Problem: Education: Goal: Knowledge of General Education information will improve Description: Including pain rating scale, medication(s)/side effects and non-pharmacologic comfort measures Outcome: Not Progressing Note: Patient experiencing confusion at this time. Will continue to reorient and educate as needed.   Problem: Health Behavior/Discharge Planning: Goal: Ability to manage health-related needs will improve Outcome: Not Progressing Note: Patient experiencing confusion at this time. Will continue to reorient and educate as needed.

## 2022-08-10 NOTE — Progress Notes (Addendum)
Progress Note    Carlos Frey  [id number] DOB: 1930/02/17  DOA: 08/09/2022 PCP: Derinda Late, MD      Brief Narrative:    Medical records reviewed and are as summarized below:  Carlos Frey is a 89 y.o. male  with medical history significant of paroxysmal atrial fibrillation on Xarelto, chronic diarrhea since he started Xarelto about a year ago, hypertension, hyperlipidemia, CVA, moderate aortic stenosis, history of Mobitz 1 second-degree AV block (Zio patch placed in July 2023 showed 56 pauses with mostly occurred during sleep or early morning hours-56 pauses and longest pause was 8.6 seconds).  He was brought to the hospital because of altered mental status and increased frequency of micturition.        Assessment/Plan:   Principal Problem:   Metabolic encephalopathy Active Problems:   Acute cystitis   Diarrhea   PAF (paroxysmal atrial fibrillation) (HCC)   Essential hypertension   AV block, Mobitz 1   Hypokalemia   Acute UTI: Continue IV ceftriaxone.  Follow-up urine cultures.   Acute metabolic encephalopathy: Improved.  Mental status back to baseline.   Hypokalemia: Replete potassium and monitor levels   Acute on chronic diarrhea:  Imodium as needed.  Stool for C. difficile toxin was negative.  Stool GI panel positive for enteropathogenic E. coli and adenovirus   Paroxysmal atrial fibrillation with bradycardia and sinus pauses on telemetry: Patient had Zio patch in July 2023 which showed pauses with Mobitz 1 second-degree AV block.  Continue Xarelto.   Other comorbidities include hypertension, history of stroke, moderate aortic stenosis   Diet Order             DIET DYS 3 Room service appropriate? Yes; Fluid consistency: Thin  Diet effective now                            Consultants: None  Procedures: None    Medications:    amLODipine  10 mg Oral Daily   atorvastatin  40 mg Oral Daily   lisinopril  20 mg  Oral Daily   loratadine  10 mg Oral Daily   pantoprazole  40 mg Oral Daily   rivaroxaban  20 mg Oral Q supper   sodium chloride flush  3 mL Intravenous Once   Continuous Infusions:  sodium chloride     cefTRIAXone (ROCEPHIN)  IV 1 g (08/10/22 1015)     Anti-infectives (From admission, onward)    Start     Dose/Rate Route Frequency Ordered Stop   08/10/22 1000  cefTRIAXone (ROCEPHIN) 1 g in sodium chloride 0.9 % 100 mL IVPB        1 g 200 mL/hr over 30 Minutes Intravenous Every 24 hours 08/09/22 1632     08/09/22 1530  cefTRIAXone (ROCEPHIN) 1 g in sodium chloride 0.9 % 100 mL IVPB        1 g 200 mL/hr over 30 Minutes Intravenous  Once 08/09/22 1516 08/09/22 1726              Family Communication/Anticipated D/C date and plan/Code Status   DVT prophylaxis:  rivaroxaban (XARELTO) tablet 20 mg     Code Status: Full Code  Family Communication: Plan discussed with 2 sons at the bedside Disposition Plan: Plan to discharge home in 1 to 2 days   Status is: Observation The patient will require care spanning > 2 midnights and should be moved to inpatient because: UTI on  IV antibiotics       Subjective:   Interval events noted.  Mental status has improved.  His 2 sons were at the bedside.  Gerald Stabs, RN, was at the bedside  Objective:    Vitals:   08/10/22 0021 08/10/22 0421 08/10/22 0806 08/10/22 1200  BP: (!) 125/52 (!) 125/44 (!) 158/67 123/64  Pulse: (!) 52 (!) 51 (!) 52 63  Resp: '16 18 16 18  '$ Temp: 99.8 F (37.7 C) 97.9 F (36.6 C) (!) 97.3 F (36.3 C) 98.1 F (36.7 C)  TempSrc:      SpO2: 97% 96% 100% 99%   No data found.   Intake/Output Summary (Last 24 hours) at 08/10/2022 1439 Last data filed at 08/10/2022 [id number] Gross per 24 hour  Intake 1093.33 ml  Output 325 ml  Net 768.33 ml   There were no vitals filed for this visit.  Exam:   GEN: NAD SKIN: No rash EYES: EOMI ENT: MMM CV: Irregular rate and rhythm PULM: CTA B ABD: soft, ND, NT,  +BS CNS: AAO x 3, non focal EXT: No edema or tenderness       Data Reviewed:   I have personally reviewed following labs and imaging studies:  Labs: Labs show the following:   Basic Metabolic Panel: Recent Labs  Lab 08/09/22 1300 08/10/22 0522  NA 137 135  K 3.7 3.4*  CL 105 105  CO2 18* 22  GLUCOSE 121* 87  BUN 19 14  CREATININE 0.90 0.76  CALCIUM 9.5 8.4*   GFR CrCl cannot be calculated (Unknown ideal weight.). Liver Function Tests: Recent Labs  Lab 08/09/22 1300  AST 32  ALT 17  ALKPHOS 85  BILITOT 1.5*  PROT 7.7  ALBUMIN 4.4   No results for input(s): "LIPASE", "AMYLASE" in the last 168 hours. No results for input(s): "AMMONIA" in the last 168 hours. Coagulation profile Recent Labs  Lab 08/09/22 1300  INR 1.4*    CBC: Recent Labs  Lab 08/09/22 1300 08/10/22 0522  WBC 7.5 5.2  NEUTROABS 5.5  --   HGB 14.6 12.1*  HCT 43.1 34.9*  MCV 98.2 96.9  PLT 163 134*   Cardiac Enzymes: No results for input(s): "CKTOTAL", "CKMB", "CKMBINDEX", "TROPONINI" in the last 168 hours. BNP (last 3 results) No results for input(s): "PROBNP" in the last 8760 hours. CBG: Recent Labs  Lab 08/09/22 1306  GLUCAP 117*   D-Dimer: No results for input(s): "DDIMER" in the last 72 hours. Hgb A1c: No results for input(s): "HGBA1C" in the last 72 hours. Lipid Profile: No results for input(s): "CHOL", "HDL", "LDLCALC", "TRIG", "CHOLHDL", "LDLDIRECT" in the last 72 hours. Thyroid function studies: No results for input(s): "TSH", "T4TOTAL", "T3FREE", "THYROIDAB" in the last 72 hours.  Invalid input(s): "FREET3" Anemia work up: No results for input(s): "VITAMINB12", "FOLATE", "FERRITIN", "TIBC", "IRON", "RETICCTPCT" in the last 72 hours. Sepsis Labs: Recent Labs  Lab 08/09/22 1300 08/10/22 0522  WBC 7.5 5.2    Microbiology Recent Results (from the past 240 hour(s))  Resp panel by RT-PCR (RSV, Flu A&B, Covid) Anterior Nasal Swab     Status: None   Collection  Time: 08/09/22  2:24 PM   Specimen: Anterior Nasal Swab  Result Value Ref Range Status   SARS Coronavirus 2 by RT PCR NEGATIVE NEGATIVE Final    Comment: (NOTE) SARS-CoV-2 target nucleic acids are NOT DETECTED.  The SARS-CoV-2 RNA is generally detectable in upper respiratory specimens during the acute phase of infection. The lowest concentration of SARS-CoV-2 viral  copies this assay can detect is 138 copies/mL. A negative result does not preclude SARS-Cov-2 infection and should not be used as the sole basis for treatment or other patient management decisions. A negative result may occur with  improper specimen collection/handling, submission of specimen other than nasopharyngeal swab, presence of viral mutation(s) within the areas targeted by this assay, and inadequate number of viral copies(<138 copies/mL). A negative result must be combined with clinical observations, patient history, and epidemiological information. The expected result is Negative.  Fact Sheet for Patients:  EntrepreneurPulse.com.au  Fact Sheet for Healthcare Providers:  IncredibleEmployment.be  This test is no t yet approved or cleared by the [place] FDA and  has been authorized for detection and/or diagnosis of SARS-CoV-2 by FDA under an Emergency Use Authorization (EUA). This EUA will remain  in effect (meaning this test can be used) for the duration of the COVID-19 declaration under Section 564(b)(1) of the Act, 21 U.S.C.section 360bbb-3(b)(1), unless the authorization is terminated  or revoked sooner.       Influenza A by PCR NEGATIVE NEGATIVE Final   Influenza B by PCR NEGATIVE NEGATIVE Final    Comment: (NOTE) The Xpert Xpress SARS-CoV-2/FLU/RSV plus assay is intended as an aid in the diagnosis of influenza from Nasopharyngeal swab specimens and should not be used as a sole basis for treatment. Nasal washings and aspirates are unacceptable for Xpert Xpress  SARS-CoV-2/FLU/RSV testing.  Fact Sheet for Patients: EntrepreneurPulse.com.au  Fact Sheet for Healthcare Providers: IncredibleEmployment.be  This test is not yet approved or cleared by the [place] FDA and has been authorized for detection and/or diagnosis of SARS-CoV-2 by FDA under an Emergency Use Authorization (EUA). This EUA will remain in effect (meaning this test can be used) for the duration of the COVID-19 declaration under Section 564(b)(1) of the Act, 21 U.S.C. section 360bbb-3(b)(1), unless the authorization is terminated or revoked.     Resp Syncytial Virus by PCR NEGATIVE NEGATIVE Final    Comment: (NOTE) Fact Sheet for Patients: EntrepreneurPulse.com.au  Fact Sheet for Healthcare Providers: IncredibleEmployment.be  This test is not yet approved or cleared by the [place] FDA and has been authorized for detection and/or diagnosis of SARS-CoV-2 by FDA under an Emergency Use Authorization (EUA). This EUA will remain in effect (meaning this test can be used) for the duration of the COVID-19 declaration under Section 564(b)(1) of the Act, 21 U.S.C. section 360bbb-3(b)(1), unless the authorization is terminated or revoked.  Performed at [facility], [place], [place] [address]   C Difficile Quick Screen w PCR reflex     Status: None   Collection Time: 08/10/22 12:17 PM   Specimen: STOOL  Result Value Ref Range Status   C Diff antigen NEGATIVE NEGATIVE Final   C Diff toxin NEGATIVE NEGATIVE Final   C Diff interpretation No C. difficile detected.  Final    Comment: Performed at [facility], [address] [place], [place] [address]    Procedures and diagnostic studies:  CT HEAD WO CONTRAST  Result Date: 08/09/2022 CLINICAL DATA:  Altered mental status.  Possible stroke. EXAM: CT HEAD WITHOUT CONTRAST TECHNIQUE: Contiguous axial images were  obtained from the base of the skull through the vertex without intravenous contrast. RADIATION DOSE REDUCTION: This exam was performed according to the departmental dose-optimization program which includes automated exposure control, adjustment of the mA and/or kV according to patient size and/or use of iterative reconstruction technique. COMPARISON:  MRI brain dated December 20, 2021.  CT head dated December 08, 2021. FINDINGS: Brain: No evidence of acute infarction, hemorrhage, hydrocephalus, extra-axial collection or mass lesion/mass effect. Stable atrophy and chronic microvascular ischemic changes. Vascular: Calcified atherosclerosis at the skull base. No hyperdense vessel. Skull: Normal. Negative for fracture or focal lesion. Sinuses/Orbits: No acute finding. Other: None. IMPRESSION: 1. No acute intracranial abnormality. Electronically Signed   By: Titus Dubin M.D.   On: 08/09/2022 14:09               LOS: 0 days   Taivon Haroon  [facility]   Pager on [email or web address]. If 7PM-7AM, please contact night-coverage at [email or web address]     08/10/2022, 2:39 PM

## 2022-08-11 DIAGNOSIS — A044 Other intestinal Escherichia coli infections: Secondary | ICD-10-CM | POA: Diagnosis present

## 2022-08-11 DIAGNOSIS — G9341 Metabolic encephalopathy: Secondary | ICD-10-CM | POA: Diagnosis not present

## 2022-08-11 DIAGNOSIS — N3001 Acute cystitis with hematuria: Secondary | ICD-10-CM | POA: Diagnosis not present

## 2022-08-11 DIAGNOSIS — E876 Hypokalemia: Secondary | ICD-10-CM

## 2022-08-11 DIAGNOSIS — I480 Paroxysmal atrial fibrillation: Secondary | ICD-10-CM | POA: Diagnosis not present

## 2022-08-11 LAB — MAGNESIUM
Magnesium: 1.6 mg/dL — ABNORMAL LOW (ref 1.7–2.4)
Magnesium: 2.1 mg/dL (ref 1.7–2.4)

## 2022-08-11 LAB — CBC WITH DIFFERENTIAL/PLATELET
Abs Immature Granulocytes: 0.01 10*3/uL (ref 0.00–0.07)
Basophils Absolute: 0 10*3/uL (ref 0.0–0.1)
Basophils Relative: 0 %
Eosinophils Absolute: 0.1 10*3/uL (ref 0.0–0.5)
Eosinophils Relative: 2 %
HCT: 37.3 % — ABNORMAL LOW (ref 39.0–52.0)
Hemoglobin: 12.7 g/dL — ABNORMAL LOW (ref 13.0–17.0)
Immature Granulocytes: 0 %
Lymphocytes Relative: 21 %
Lymphs Abs: 1 10*3/uL (ref 0.7–4.0)
MCH: 32.8 pg (ref 26.0–34.0)
MCHC: 34 g/dL (ref 30.0–36.0)
MCV: 96.4 fL (ref 80.0–100.0)
Monocytes Absolute: 0.5 10*3/uL (ref 0.1–1.0)
Monocytes Relative: 11 %
Neutro Abs: 3.3 10*3/uL (ref 1.7–7.7)
Neutrophils Relative %: 66 %
Platelets: 141 10*3/uL — ABNORMAL LOW (ref 150–400)
RBC: 3.87 MIL/uL — ABNORMAL LOW (ref 4.22–5.81)
RDW: 12.9 % (ref 11.5–15.5)
WBC: 4.9 10*3/uL (ref 4.0–10.5)
nRBC: 0 % (ref 0.0–0.2)

## 2022-08-11 LAB — URINE CULTURE

## 2022-08-11 LAB — POTASSIUM
Potassium: 3.2 mmol/L — ABNORMAL LOW (ref 3.5–5.1)
Potassium: 3.4 mmol/L — ABNORMAL LOW (ref 3.5–5.1)

## 2022-08-11 MED ORDER — CIPROFLOXACIN HCL 500 MG PO TABS: 500.0000 mg | ORAL_TABLET | Freq: Two times a day (BID) | ORAL | 0 refills | Status: AC

## 2022-08-11 MED ORDER — POTASSIUM CHLORIDE CRYS ER 10 MEQ PO TBCR: 10.0000 meq | EXTENDED_RELEASE_TABLET | Freq: Every day | ORAL | 0 refills | Status: DC

## 2022-08-11 MED ORDER — CIPROFLOXACIN HCL 500 MG PO TABS: 500.0000 mg | ORAL_TABLET | Freq: Two times a day (BID) | ORAL | Status: DC

## 2022-08-11 MED ORDER — POTASSIUM CHLORIDE CRYS ER 20 MEQ PO TBCR
40.0000 meq | EXTENDED_RELEASE_TABLET | Freq: Once | ORAL | Status: AC
  Administered 2022-08-11: 40 meq via ORAL
  Filled 2022-08-11: qty 2

## 2022-08-11 MED ORDER — MAGNESIUM SULFATE 2 GM/50ML IV SOLN
2.0000 g | Freq: Once | INTRAVENOUS | Status: AC
  Administered 2022-08-11: 2 g via INTRAVENOUS
  Filled 2022-08-11: qty 50

## 2022-08-11 NOTE — Discharge Summary (Addendum)
Physician Discharge Summary   Patient: Carlos Frey MRN: [id number] DOB: July 03, 1929  Admit date:     08/09/2022  Discharge date: 08/11/22  Discharge Physician: Jennye Boroughs   PCP: Derinda Late, MD   Recommendations at discharge:   Follow-up with PCP in 1 to 2 weeks  Discharge Diagnoses: Principal Problem:   Metabolic encephalopathy Active Problems:   Acute cystitis   Diarrhea   PAF (paroxysmal atrial fibrillation) (HCC)   Essential hypertension   AV block, Mobitz 1   Hypokalemia   E coli enteritis  Resolved Problems:   * No resolved hospital problems. *  Hospital Course:  Carlos Frey is a 89 y.o. male  with medical history significant of paroxysmal atrial fibrillation on Xarelto, chronic diarrhea since he started Xarelto about a year ago, hypertension, hyperlipidemia, CVA, moderate aortic stenosis, history of Mobitz 1 second-degree AV block (Zio patch placed in July 2023 showed 56 pauses with mostly occurred during sleep or early morning hours-56 pauses and longest pause was 8.6 seconds).  He was brought to the hospital because of altered mental status and increased frequency of micturition.    Assessment and Plan:  Acute UTI: Urine culture showed multiple species.  Patient will be discharged on 3-day course of ciprofloxacin because of concomitant E. coli diarrhea.     Acute metabolic encephalopathy: Improved.  Mental status back to baseline.     Hypokalemia: Improved.  Continue potassium repletion at discharge.     Acute on chronic diarrhea:  Imodium as needed.  Stool for C. difficile toxin was negative.  Stool GI panel positive for enteropathogenic E. coli and adenovirus.  He will be discharged on ciprofloxacin.     Paroxysmal atrial fibrillation with bradycardia and sinus pauses on telemetry: He is asymptomatic.  Patient had Zio patch in July 2023 which showed pauses with Mobitz 1 second-degree AV block.  Continue Xarelto.     Other comorbidities  include hypertension, history of stroke, moderate aortic stenosis    * Metabolic encephalopathy Patient presenting with disorientation and confusion, likely metabolic in the setting of underlying infection. No focal neurological deficits on examination to suggest CVA/TIA.   - Dysphagia diet until improvements in mental status - Management of diarrhea and UTI as noted below - IV fluids - Delirium precautions  Acute cystitis Patient presenting with acute encephalopathy and urinalysis with leukocytes, elevated WBC, however no bacteria.  Given history of worsening urinary frequency and urgency, will cover with ceftriaxone until urine culture returns.  - Continue ceftriaxone 1 g daily - Urine culture pending  Diarrhea Patient's wife states that he has been experiencing on and off diarrhea for several months now, however in the last 24 hours, it has been acutely severe compared to prior.  This is likely the explanation for patient's metabolic acidosis as well.  No recent antibiotic use.  Patient's wife feels that symptoms started after initiating Xarelto, however this isn't a typical reaction with Xarelto.  He did start Celexa around the same time, which is known to give diarrhea though.  Given acute worsening with evaluate for inflammatory and infectious etiology. Exam is non-focal with no pain, so no indication for imaging.   - GI panel, fecal lactoferrin, fecal calprotectin pending - IV fluids  PAF (paroxysmal atrial fibrillation) (HCC) Patient's EKG today has multiple PACs and PVCs making it difficult to discern the underlying rhythm, however likely atrial fibrillation versus junctional rhythm.  - Repeat EKG - Continue home Xarelto  Essential hypertension - Continue home antihypertensives  Consultants: None Procedures performed: None  Disposition: Home Diet recommendation:  Discharge Diet Orders (From admission, onward)     Start     Ordered   08/11/22 0000  Diet -  low sodium heart healthy        08/11/22 1418           Cardiac diet DISCHARGE MEDICATION: Allergies as of 08/11/2022   No Known Allergies      Medication List     TAKE these medications    amLODipine 10 MG tablet Commonly known as: NORVASC Take 1 tablet (10 mg total) by mouth daily.   atorvastatin 40 MG tablet Commonly known as: Lipitor Take 1 tablet (40 mg total) by mouth daily.   cetirizine 10 MG tablet Commonly known as: ZYRTEC Take 10 mg by mouth daily.   ciprofloxacin 500 MG tablet Commonly known as: CIPRO Take 1 tablet (500 mg total) by mouth 2 (two) times daily for 3 days. Start taking on: August 12, 2022   citalopram 10 MG tablet Commonly known as: CELEXA Take 10 mg by mouth daily.   lisinopril 20 MG tablet Commonly known as: ZESTRIL Take 20 mg by mouth daily.   omeprazole 40 MG capsule Commonly known as: PRILOSEC Take 40 mg by mouth daily.   polyethylene glycol 17 g packet Commonly known as: MIRALAX / GLYCOLAX Take 17 g by mouth daily.   potassium chloride 10 MEQ tablet Commonly known as: KLOR-CON M Take 1 tablet (10 mEq total) by mouth daily for 5 days.   rivaroxaban 20 MG Tabs tablet Commonly known as: XARELTO Take 1 tablet (20 mg total) by mouth daily with supper.        Discharge Exam:  GEN: NAD SKIN: No rash EYES: EOMI ENT: MMM CV: RRR PULM: CTA B ABD: soft, ND, NT, +BS CNS: AAO x 3, non focal EXT: No edema or tenderness   Condition at discharge: good  The results of significant diagnostics from this hospitalization (including imaging, microbiology, ancillary and laboratory) are listed below for reference.   Imaging Studies: CT HEAD WO CONTRAST  Result Date: 08/09/2022 CLINICAL DATA:  Altered mental status.  Possible stroke. EXAM: CT HEAD WITHOUT CONTRAST TECHNIQUE: Contiguous axial images were obtained from the base of the skull through the vertex without intravenous contrast. RADIATION DOSE REDUCTION: This exam was  performed according to the departmental dose-optimization program which includes automated exposure control, adjustment of the mA and/or kV according to patient size and/or use of iterative reconstruction technique. COMPARISON:  MRI brain dated December 20, 2021. CT head dated December 08, 2021. FINDINGS: Brain: No evidence of acute infarction, hemorrhage, hydrocephalus, extra-axial collection or mass lesion/mass effect. Stable atrophy and chronic microvascular ischemic changes. Vascular: Calcified atherosclerosis at the skull base. No hyperdense vessel. Skull: Normal. Negative for fracture or focal lesion. Sinuses/Orbits: No acute finding. Other: None. IMPRESSION: 1. No acute intracranial abnormality. Electronically Signed   By: Titus Dubin M.D.   On: 08/09/2022 14:09    Microbiology: Results for orders placed or performed during the hospital encounter of 08/09/22  Resp panel by RT-PCR (RSV, Flu A&B, Covid) Anterior Nasal Swab     Status: None   Collection Time: 08/09/22  2:24 PM   Specimen: Anterior Nasal Swab  Result Value Ref Range Status   SARS Coronavirus 2 by RT PCR NEGATIVE NEGATIVE Final    Comment: (NOTE) SARS-CoV-2 target nucleic acids are NOT DETECTED.  The SARS-CoV-2 RNA is generally detectable in upper respiratory specimens during the acute  phase of infection. The lowest concentration of SARS-CoV-2 viral copies this assay can detect is 138 copies/mL. A negative result does not preclude SARS-Cov-2 infection and should not be used as the sole basis for treatment or other patient management decisions. A negative result may occur with  improper specimen collection/handling, submission of specimen other than nasopharyngeal swab, presence of viral mutation(s) within the areas targeted by this assay, and inadequate number of viral copies(<138 copies/mL). A negative result must be combined with clinical observations, patient history, and epidemiological information. The expected result is  Negative.  Fact Sheet for Patients:  EntrepreneurPulse.com.au  Fact Sheet for Healthcare Providers:  IncredibleEmployment.be  This test is no t yet approved or cleared by the [place] FDA and  has been authorized for detection and/or diagnosis of SARS-CoV-2 by FDA under an Emergency Use Authorization (EUA). This EUA will remain  in effect (meaning this test can be used) for the duration of the COVID-19 declaration under Section 564(b)(1) of the Act, 21 U.S.C.section 360bbb-3(b)(1), unless the authorization is terminated  or revoked sooner.       Influenza A by PCR NEGATIVE NEGATIVE Final   Influenza B by PCR NEGATIVE NEGATIVE Final    Comment: (NOTE) The Xpert Xpress SARS-CoV-2/FLU/RSV plus assay is intended as an aid in the diagnosis of influenza from Nasopharyngeal swab specimens and should not be used as a sole basis for treatment. Nasal washings and aspirates are unacceptable for Xpert Xpress SARS-CoV-2/FLU/RSV testing.  Fact Sheet for Patients: EntrepreneurPulse.com.au  Fact Sheet for Healthcare Providers: IncredibleEmployment.be  This test is not yet approved or cleared by the [place] FDA and has been authorized for detection and/or diagnosis of SARS-CoV-2 by FDA under an Emergency Use Authorization (EUA). This EUA will remain in effect (meaning this test can be used) for the duration of the COVID-19 declaration under Section 564(b)(1) of the Act, 21 U.S.C. section 360bbb-3(b)(1), unless the authorization is terminated or revoked.     Resp Syncytial Virus by PCR NEGATIVE NEGATIVE Final    Comment: (NOTE) Fact Sheet for Patients: EntrepreneurPulse.com.au  Fact Sheet for Healthcare Providers: IncredibleEmployment.be  This test is not yet approved or cleared by the [place] FDA and has been authorized for detection and/or diagnosis of  SARS-CoV-2 by FDA under an Emergency Use Authorization (EUA). This EUA will remain in effect (meaning this test can be used) for the duration of the COVID-19 declaration under Section 564(b)(1) of the Act, 21 U.S.C. section 360bbb-3(b)(1), unless the authorization is terminated or revoked.  Performed at [facility], [address], [place], [place] [address]   Urine Culture     Status: Abnormal   Collection Time: 08/09/22  2:24 PM   Specimen: Urine, Random  Result Value Ref Range Status   Specimen Description   Final    URINE, RANDOM Performed at [facility], [address] [place], [place] [address]    Special Requests   Final    NONE Performed at [facility], [address] [place], [place] [address]    Culture MULTIPLE SPECIES PRESENT, SUGGEST RECOLLECTION (A)  Final   Report Status 08/11/2022 FINAL  Final  Gastrointestinal Panel by PCR , Stool     Status: Abnormal   Collection Time: 08/10/22 12:17 PM   Specimen: STOOL  Result Value Ref Range Status   Campylobacter species NOT DETECTED NOT DETECTED Final   Plesimonas shigelloides NOT DETECTED NOT DETECTED Final   Salmonella species NOT DETECTED NOT DETECTED Final  Yersinia enterocolitica NOT DETECTED NOT DETECTED Final   Vibrio species NOT DETECTED NOT DETECTED Final   Vibrio cholerae NOT DETECTED NOT DETECTED Final   Enteroaggregative E coli (EAEC) NOT DETECTED NOT DETECTED Final   Enteropathogenic E coli (EPEC) DETECTED (A) NOT DETECTED Final    Comment: RESULT CALLED TO, READ BACK BY AND VERIFIED WITH: CHRIS BENNETT AT 1456 08/10/22.PMF    Enterotoxigenic E coli (ETEC) NOT DETECTED NOT DETECTED Final   Shiga like toxin producing E coli (STEC) NOT DETECTED NOT DETECTED Final   Shigella/Enteroinvasive E coli (EIEC) NOT DETECTED NOT DETECTED Final   Cryptosporidium NOT DETECTED NOT DETECTED Final   Cyclospora cayetanensis NOT DETECTED NOT DETECTED Final   Entamoeba histolytica NOT  DETECTED NOT DETECTED Final   Giardia lamblia NOT DETECTED NOT DETECTED Final   Adenovirus F40/41 DETECTED (A) NOT DETECTED Final   Astrovirus NOT DETECTED NOT DETECTED Final   Norovirus GI/GII NOT DETECTED NOT DETECTED Final   Rotavirus A NOT DETECTED NOT DETECTED Final   Sapovirus (I, II, IV, and V) NOT DETECTED NOT DETECTED Final    Comment: Performed at [facility], [address] [place], [place] [address]  C Difficile Quick Screen w PCR reflex     Status: None   Collection Time: 08/10/22 12:17 PM   Specimen: STOOL  Result Value Ref Range Status   C Diff antigen NEGATIVE NEGATIVE Final   C Diff toxin NEGATIVE NEGATIVE Final   C Diff interpretation No C. difficile detected.  Final    Comment: Performed at [facility], [address] [place], [place] [address]    Labs: CBC: Recent Labs  Lab 08/09/22 1300 08/10/22 0522 08/11/22 0807  WBC 7.5 5.2 4.9  NEUTROABS 5.5  --  3.3  HGB 14.6 12.1* 12.7*  HCT 43.1 34.9* 37.3*  MCV 98.2 96.9 96.4  PLT 163 134* [id number]*   Basic Metabolic Panel: Recent Labs  Lab 08/09/22 1300 08/10/22 0522 08/11/22 0807 08/11/22 1333  NA 137 135  --   --   K 3.7 3.4* 3.2* 3.4*  CL 105 105  --   --   CO2 18* 22  --   --   GLUCOSE 121* 87  --   --   BUN 19 14  --   --   CREATININE 0.90 0.76  --   --   CALCIUM 9.5 8.4*  --   --   MG  --   --  1.6* 2.1   Liver Function Tests: Recent Labs  Lab 08/09/22 1300  AST 32  ALT 17  ALKPHOS 85  BILITOT 1.5*  PROT 7.7  ALBUMIN 4.4   CBG: Recent Labs  Lab 08/09/22 1306  GLUCAP 117*    Discharge time spent: greater than 30 minutes.  Signed: Jennye Boroughs, MD [facility] 08/11/2022

## 2022-08-11 NOTE — Plan of Care (Signed)

## 2022-08-11 NOTE — Progress Notes (Signed)
Pt d/c to home via son. IV removed intact. VSS. Education completed. All questions answered. Belongings sent with pt.

## 2022-08-16 LAB — CALPROTECTIN, FECAL: Calprotectin, Fecal: 92 ug/g (ref 0–120)

## 2022-09-10 ENCOUNTER — Telehealth: Payer: Self-pay | Admitting: Cardiovascular Disease

## 2022-09-10 NOTE — Telephone Encounter (Signed)
Pt c/o medication issue:  1. Name of Medication: rivaroxaban (XARELTO) 20 MG TABS tablet   2. How are you currently taking this medication (dosage and times per day)?  Take 1 tablet (20 mg total) by mouth daily with supper.   3. Are you having a reaction (difficulty breathing--STAT)? no  4. What is your medication issue? Patient states that he couldn't handle the Xarelto. He started back taking the elliquis, would like a prescription for that medication sent it. Please advise

## 2022-09-11 MED ORDER — APIXABAN 5 MG PO TABS: 5.0000 mg | ORAL_TABLET | Freq: Two times a day (BID) | ORAL | 1 refills | Status: DC

## 2022-09-11 NOTE — Telephone Encounter (Signed)
CrCl 71 mL/min prescription for Eliquis 5 mg twice daily QTY 180 Rx1 sent to [facility]

## 2022-09-11 NOTE — Telephone Encounter (Signed)
Spoke with patient and reviewed that the pharmacy team sent in the Eliquis and it was sent to his pharmacy. He verbalized understanding with no further questions.

## 2022-09-11 NOTE — Telephone Encounter (Signed)
Left voicemail message to call back  

## 2022-10-29 ENCOUNTER — Encounter: Payer: Self-pay | Admitting: Cardiovascular Disease

## 2022-10-29 ENCOUNTER — Ambulatory Visit: Payer: Medicare Other | Attending: Cardiovascular Disease | Admitting: Cardiovascular Disease

## 2022-10-29 VITALS — BP 120/50 | HR 63 | Ht 70.5 in | Wt 183.1 lb

## 2022-10-29 DIAGNOSIS — R001 Bradycardia, unspecified: Secondary | ICD-10-CM | POA: Diagnosis not present

## 2022-10-29 DIAGNOSIS — I480 Paroxysmal atrial fibrillation: Secondary | ICD-10-CM | POA: Diagnosis not present

## 2022-10-29 DIAGNOSIS — E785 Hyperlipidemia, unspecified: Secondary | ICD-10-CM

## 2022-10-29 DIAGNOSIS — G459 Transient cerebral ischemic attack, unspecified: Secondary | ICD-10-CM

## 2022-10-29 DIAGNOSIS — E782 Mixed hyperlipidemia: Secondary | ICD-10-CM

## 2022-10-29 DIAGNOSIS — I350 Nonrheumatic aortic (valve) stenosis: Secondary | ICD-10-CM

## 2022-10-29 DIAGNOSIS — I10 Essential (primary) hypertension: Secondary | ICD-10-CM

## 2022-10-29 NOTE — Patient Instructions (Addendum)
   Medication Instructions:  No changes  If you need a refill on your cardiac medications before your next appointment, please call your pharmacy.   Lab work: No new labs needed  Testing/Procedures:   Your physician has requested that you have an echocardiogram. Echocardiography is a painless test that uses sound waves to create images of your heart. It provides your doctor with information about the size and shape of your heart and how well your heart's chambers and valves are working.   You may receive an ultrasound enhancing agent through an IV if needed to better visualize your heart during the echo. This procedure takes approximately one hour.  There are no restrictions for this procedure.  This will take place at [address] ([facility] [address], [place] [address]  Follow-Up: At [facility], you and your health needs are our priority.  As part of our continuing mission to provide you with exceptional heart care, we have created designated Provider Care Teams.  These Care Teams include your primary Cardiologist (physician) and Advanced Practice Providers (APPs -  Physician Assistants and Nurse Practitioners) who all work together to provide you with the care you need, when you need it.  You will need a follow up appointment in 12 months  Providers on your designated Care Team:   Christopher Berge, NP Ryan Dunn, PA-C Cadence Furth, PA-C  COVID-19 Vaccine Information can be found at: [email or web address] For questions related to vaccine distribution or appointments, please email [email or web address] or call [phone].   

## 2022-10-29 NOTE — Progress Notes (Signed)
Cardiology Office Note  Date:  10/29/2022   ID:  Carlos Frey, DOB 02-20-1930, MRN [id number]  PCP:  Carlos Rud, MD   Chief Complaint  Patient presents with   6 month follow up     "Doing well." Medications reviewed by the patient verbally.     HPI:  [name] is a 89 year old male with above past medical history hypertension,  hyperlipidemia,  diastolic dysfunction,  moderate aortic stenosis, June 2023 mean gradient 21 sinus bradycardia, Mobitz 1 heart block,  TIA, strokes GERD, and Barrett's esophagus.  Paroxysmal atrial fibrillationNear syncope 2019 in the setting of sinus bradycardia, felt to be dehydrated, outpatient diuretic held Who presents for routine follow-up for his atrial fibrillation, history of strokes  Last seen by myself in clinic November 2023 Followed by EP, last seen September 2023  Back on eliquis, GI side effects  No significant SOB, active, rare SOB on heavy exertion No near syncope Denies significant lower extremity swelling, no PND orthopnea Reports good exercise tolerance  Weight stable  EKG personally reviewed by myself on todays visit NSR rate 63 no significant ST-T wave changes  Other past medical history reviewed  June 2023, emergency department with confusion and anorexia.  bradycardic with presence of Mobitz 1 heart block.   echocardiogram showed EF of 55 to 60% with grade 1 diastolic dysfunction and moderate aortic stenosis.   MRI was performed and showed old lacunar infarct without acute findings.   seen by neurology and placed on aspirin and Plavix.    emergency department December 08 2021 with hematuria and Plavix was discontinued.   CT urogram was reassuring and he was cleared to resume Plavix but then had recurrent hematuria and this was ultimately discontinued.    On December 20, 2021, he was seen in the emergency department with left-sided chest discomfort and right-sided numbness, as well as difficulty finding his  words.  EKG showed sinus bradycardia with first-degree AV block and intermittent Mobitz 1 heart block.  Labs were unremarkable.  Repeat MRI of the brain continue to show remote lacunar infarcts of the left external capsule with chronic microvascular ischemic changes.  He was discharged home and advised to follow-up with cardiology.  At office follow-up on July 17, he reported intermittent and often prolonged episodes of focal, left lower chest aching without associated symptoms, lasting hours at a time and resolving after drinking something or ambulating.  He also reported that previously ordered ZIO monitor was too difficult to place and therefore he returned it.    Lexiscan Myoview in the setting of chest discomfort and this was low risk without ischemia or infarct.   CT imaging did show aortic and coronary atherosclerosis.  Zio monitor, which showed a brief episode of paroxysmal atrial fibrillation and he was placed on Eliquis.   5 brief runs of nonsustained VT and 56 pauses, the longest of which lasted 8.6 seconds.  Pauses were frequently occurring in the middle of the night or in the early morning hours.   PMH:   has a past medical history of AV block, Mobitz 1, Barrett esophagus, Chest pain, Diastolic dysfunction, GERD (gastroesophageal reflux disease), Hematuria, Hyperlipidemia LDL goal <70, Hypertension, Left carotid bruit, Moderate aortic stenosis, PAF (paroxysmal atrial fibrillation) (HCC), Sinus bradycardia, and TIA (transient ischemic attack).  PSH:    Past Surgical History:  Procedure Laterality Date   APPENDECTOMY     COLONOSCOPY  08/08/03, 12/05/08 & 12/14/13   ESOPHAGOGASTRODUODENOSCOPY  10/09/09, 12/25/09 & 03/24/12  ESOPHAGOGASTRODUODENOSCOPY (EGD) WITH PROPOFOL N/A 07/17/2017   Procedure: ESOPHAGOGASTRODUODENOSCOPY (EGD) WITH PROPOFOL;  Surgeon: Christena Deem, MD;  Location: [facility] ENDOSCOPY;  Service: Endoscopy;  Laterality: N/A;   TONSILLECTOMY      Current Outpatient Medications   Medication Sig Dispense Refill   amLODipine (NORVASC) 10 MG tablet Take 1 tablet (10 mg total) by mouth daily. 180 tablet 3   apixaban (ELIQUIS) 5 MG TABS tablet Take 1 tablet (5 mg total) by mouth 2 (two) times daily. 180 tablet 1   atorvastatin (LIPITOR) 40 MG tablet Take 1 tablet (40 mg total) by mouth daily. 30 tablet 0   cetirizine (ZYRTEC) 10 MG tablet Take 10 mg by mouth daily.     citalopram (CELEXA) 10 MG tablet Take 10 mg by mouth daily.     lisinopril (ZESTRIL) 20 MG tablet Take 20 mg by mouth daily.     omeprazole (PRILOSEC) 40 MG capsule Take 40 mg by mouth daily.     polyethylene glycol (MIRALAX / GLYCOLAX) 17 g packet Take 17 g by mouth daily.     No current facility-administered medications for this visit.   Allergies:   Patient has no known allergies.   Social History:  The patient  reports that he quit smoking about 59 years ago. His smoking use included pipe and cigars. He has never used smokeless tobacco. He reports that he does not drink alcohol and does not use drugs.   Family History:   family history includes Diabetes in his father.    Review of Systems: Review of Systems  Constitutional: Negative.   HENT: Negative.    Respiratory: Negative.    Cardiovascular: Negative.   Gastrointestinal: Negative.   Musculoskeletal: Negative.   Neurological: Negative.   Psychiatric/Behavioral: Negative.    All other systems reviewed and are negative.    PHYSICAL EXAM: VS:  BP (!) 120/50 (BP Location: Left Arm, Patient Position: Sitting, Cuff Size: Normal)   Pulse 63   Ht 5' 10.5" (1.791 m)   Wt 183 lb 2 oz (83.1 kg)   SpO2 96%   BMI 25.90 kg/m  , BMI Body mass index is 25.9 kg/m. Constitutional:  oriented to person, place, and time. No distress.  HENT:  Head: Grossly normal Eyes:  no discharge. No scleral icterus.  Neck: No JVD, no carotid bruits  Cardiovascular: Regular rate and rhythm, no murmurs appreciated Pulmonary/Chest: Clear to auscultation  bilaterally, no wheezes or rails Abdominal: Soft.  no distension.  no tenderness.  Musculoskeletal: Normal range of motion Neurological:  normal muscle tone. Coordination normal. No atrophy Skin: Skin warm and dry Psychiatric: normal affect, pleasant  Recent Labs: 11/15/2021: TSH 1.861 08/09/2022: ALT 17 08/10/2022: BUN 14; Creatinine, Ser 0.76; Sodium 135 08/11/2022: Hemoglobin 12.7; Magnesium 2.1; Platelets 141; Potassium 3.4    Lipid Panel Lab Results  Component Value Date   CHOL 127 12/09/2021   HDL 28 (L) 12/09/2021   LDLCALC 81 12/09/2021   TRIG 90 12/09/2021      Wt Readings from Last 3 Encounters:  10/29/22 183 lb 2 oz (83.1 kg)  04/30/22 181 lb 8 oz (82.3 kg)  02/14/22 180 lb 8 oz (81.9 kg)     ASSESSMENT AND PLAN:  Problem List Items Addressed This Visit       Cardiology Problems   Essential hypertension   TIA (transient ischemic attack)   Other Visit Diagnoses     Paroxysmal atrial fibrillation (HCC)    -  Primary   Sinus bradycardia  Moderate aortic stenosis       Mixed hyperlipidemia       Hyperlipidemia LDL goal <70         Paroxysmal atrial fibrillation Maintaining normal sinus rhythm, not on rate controlling medication secondary to history of bradycardia side effects from Xarelto, Pradaxa Able to tolerate Eliquis 5 twice daily though gives him mild GI issues  History of TIA Stressed importance of staying on anticoagulation given history of paroxysmal atrial fibrillation  Moderate aortic valve stenosis Last evaluated June 2023 mean gradient of 20 Echocardiogram scheduled end of summer 2024  Essential hypertension Blood pressure is well controlled on today's visit. No changes made to the medications.  Hyperlipidemia Cholesterol is at goal on the current lipid regimen. No changes to the medications were made.   Total encounter time more than 30 minutes  Greater than 50% was spent in counseling and coordination of care with the  patient   Signed, Dossie Arbour, M.D., Ph.D. [facility] [place], [place] [phone]

## 2022-12-24 ENCOUNTER — Ambulatory Visit: Payer: Medicare Other | Attending: Cardiovascular Disease

## 2022-12-24 DIAGNOSIS — I350 Nonrheumatic aortic (valve) stenosis: Secondary | ICD-10-CM

## 2022-12-24 DIAGNOSIS — I480 Paroxysmal atrial fibrillation: Secondary | ICD-10-CM

## 2022-12-25 LAB — ECHOCARDIOGRAM COMPLETE
AR max vel: 1.34 cm2
AV Area VTI: 1.36 cm2
AV Area mean vel: 1.25 cm2
AV Mean grad: 18 mmHg
AV Peak grad: 30.7 mmHg
Ao pk vel: 2.77 m/s
Area-P 1/2: 3.44 cm2
Calc EF: 53.5 %
S' Lateral: 3.1 cm
Single Plane A2C EF: 54.5 %
Single Plane A4C EF: 53.7 %

## 2022-12-30 ENCOUNTER — Telehealth: Payer: Self-pay | Admitting: Cardiovascular Disease

## 2022-12-30 NOTE — Telephone Encounter (Signed)
Patient made aware of results and verbalized understanding.    Antonieta Iba, MD 12/28/2022  2:18 PM EDT     Echo Normal RV and LV function Dilated left atrium Moderate Aortic valve stenosis relatively unchanged from prior study 6/23

## 2022-12-30 NOTE — Telephone Encounter (Signed)
Patient is returning call to discuss echo results. °

## 2023-01-02 IMAGING — MR MR HEAD W/O CM
12 series · 47 of 48 positions shown · non-contrast
Comparison: None

CLINICAL DATA: Neuro deficit, acute, stroke suspected intermittent
slurred speech

EXAM:
MRI HEAD WITHOUT CONTRAST
TECHNIQUE: Multiplanar, multiecho pulse sequences of the brain and surrounding
structures were obtained without intravenous contrast.

[Series 5: ax dwi_tracew · axial · 3.0mm · 0.65mm/px · z∈[-77,+77]mm · 3 of 48 slices shown]
[im 1/48]
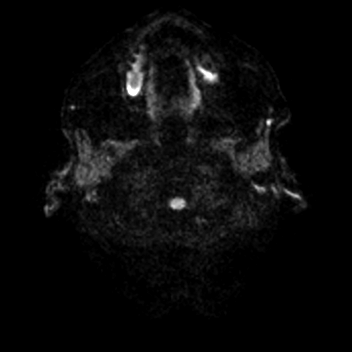
[im 24/48]
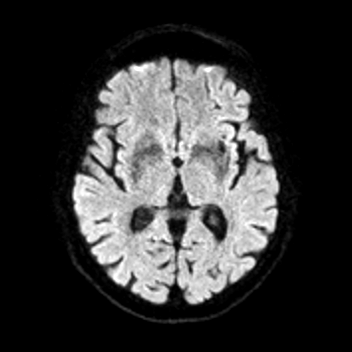
[im 48/48]
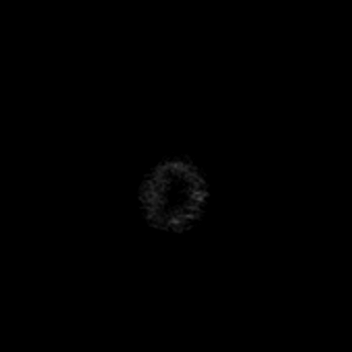

[Series 6: ax dwi_adc · axial · 3.0mm · 0.65mm/px · z∈[-77,+77]mm · 3 of 48 slices shown]
[im 1/48]
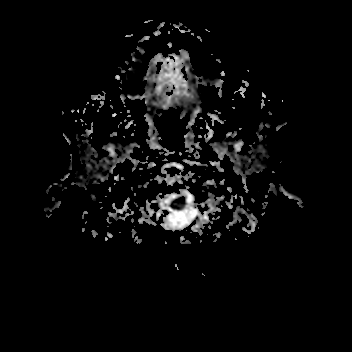
[im 24/48]
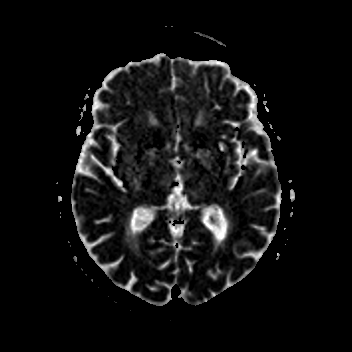
[im 48/48]
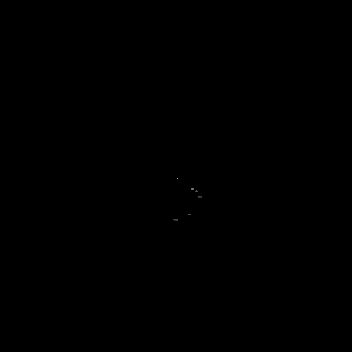

[Series 7: cor dwi_tracew · coronal · 5.0mm · 0.60mm/px · 3 of 38 slices shown]
[im 1/38]
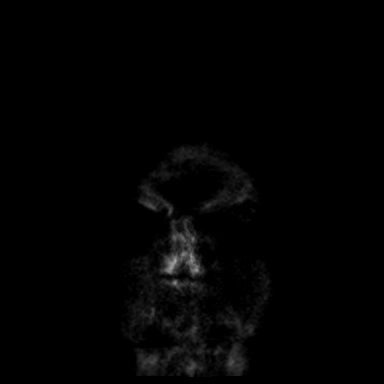
[im 19/38]
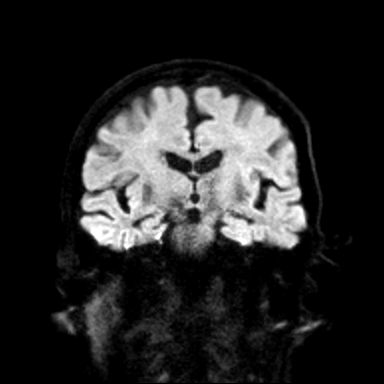
[im 38/38]
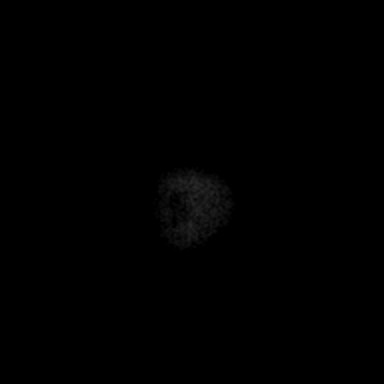

[Series 8: cor dwi_adc · coronal · 5.0mm · 0.60mm/px · 3 of 37 slices shown]
[im 1/37]
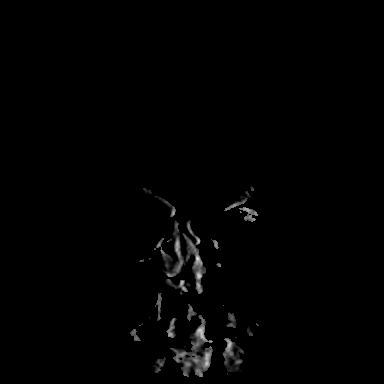
[im 19/37]
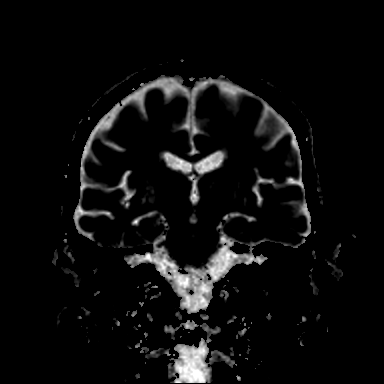
[im 37/37]
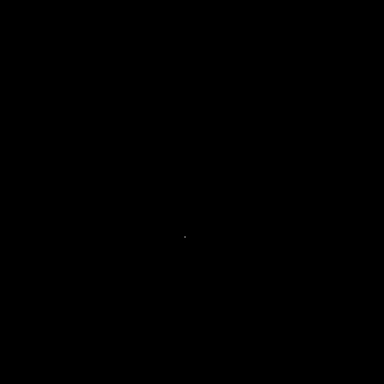

[Series 9: T1 · sagittal · 5.0mm · 0.62mm/px · 2 of 22 slices shown (1 of 2)]
[im 1/22]
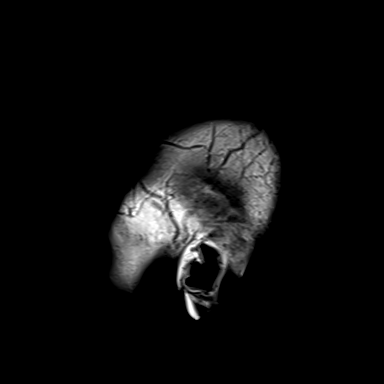
[im 22/22]
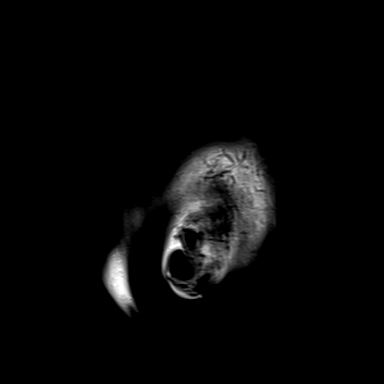

[Series 10: T2 · axial · 5.0mm · 0.53mm/px · z∈[-71,+73]mm · 2 of 25 slices shown (1 of 2)]
[im 1/25]
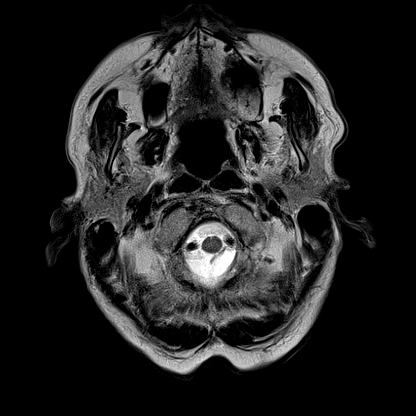
[im 25/25]
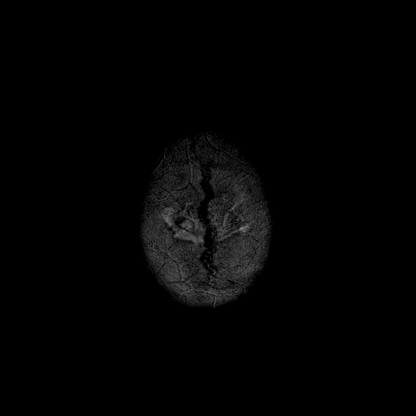

[Series 11: ax swi_mag · axial · 2.0mm · 0.90mm/px · z∈[-78,+79]mm · 5 of 80 slices shown]
[im 1/80]
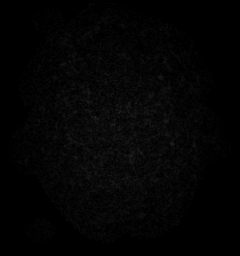
[im 20/80]
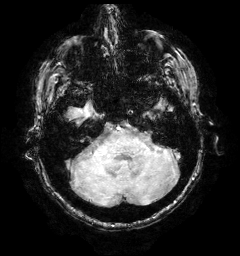
[im 40/80]
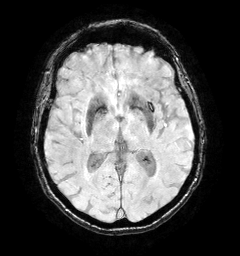
[im 60/80]
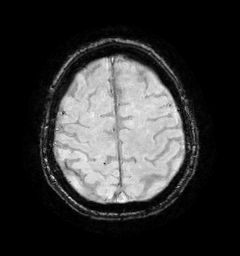
[im 80/80]
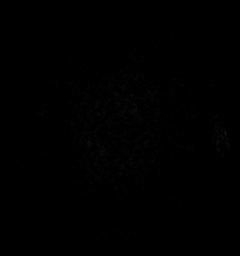

[Series 12: ax swi_pha · axial · 2.0mm · 0.90mm/px · z∈[-78,+79]mm · 5 of 80 slices shown]
[im 1/80]
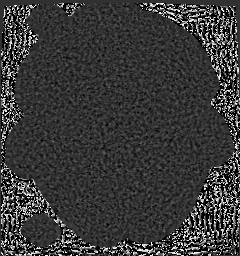
[im 20/80]
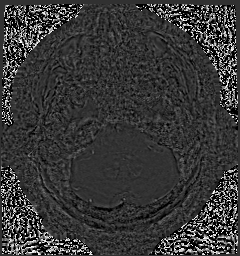
[im 40/80]
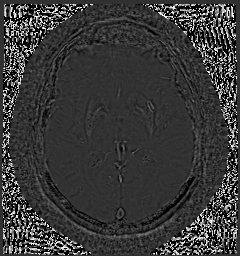
[im 60/80]
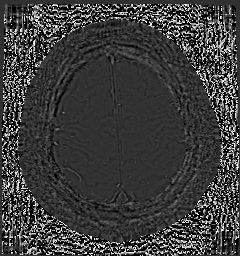
[im 80/80]
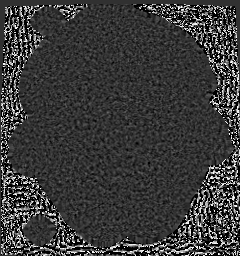

[Series 13: ax swi_swi · axial · 2.0mm · 0.90mm/px · z∈[-78,+79]mm · 5 of 80 slices shown]
[im 1/80]
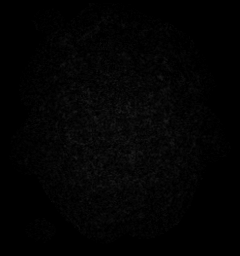
[im 20/80]
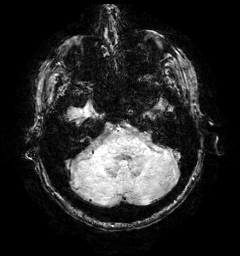
[im 40/80]
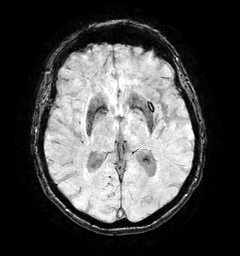
[im 60/80]
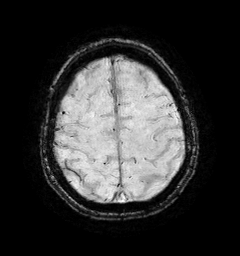
[im 80/80]
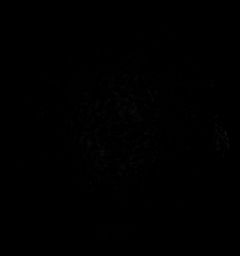

[Series 15: FLAIR · axial · 3.0mm · 0.53mm/px · z∈[-80,+82]mm · 4 of 55 slices shown]
[im 1/55]
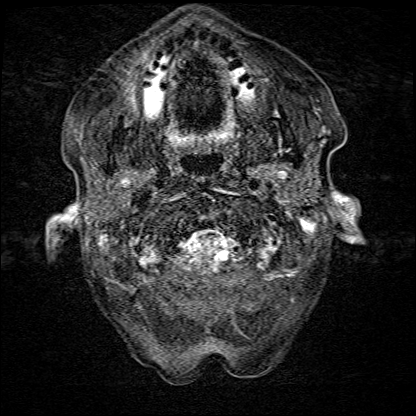
[im 19/55]
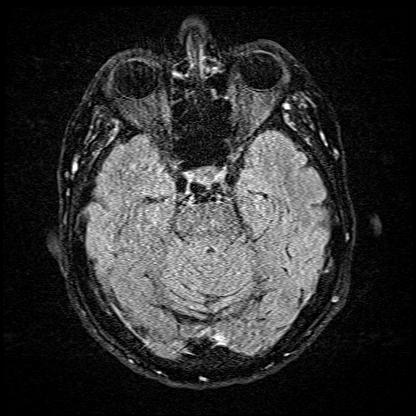
[im 37/55]
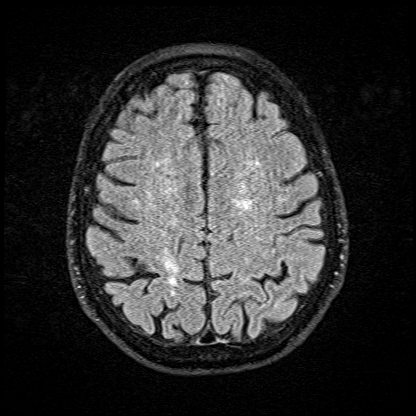
[im 55/55]
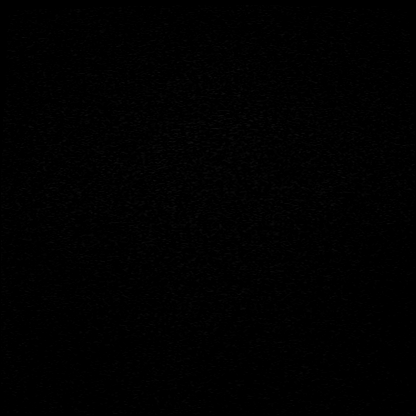

[Series 16: T1 · axial · 1.0mm · 0.98mm/px · z∈[-79,+79]mm · 10 of 160 slices shown (2 of 2)]
[im 1/160]
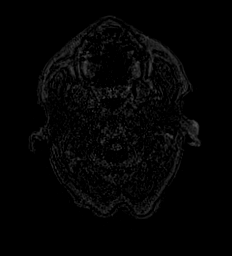
[im 16/160]
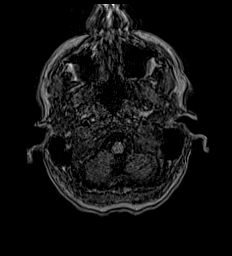
[im 32/160]
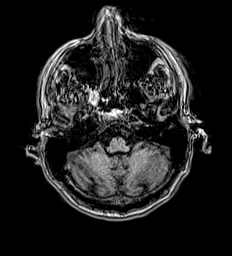
[im 48/160]
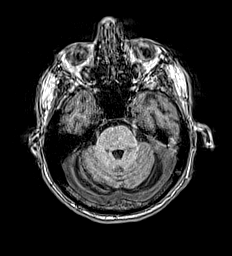
[im 64/160]
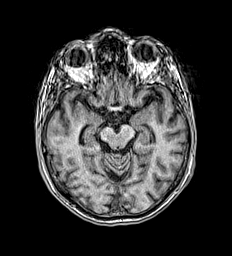
[im 80/160]
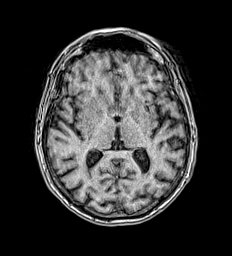
[im 96/160]
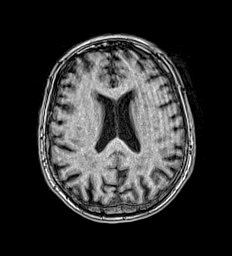
[im 112/160]
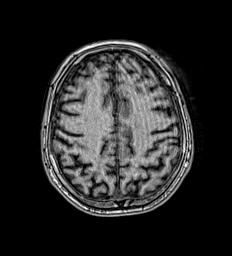
[im 128/160]
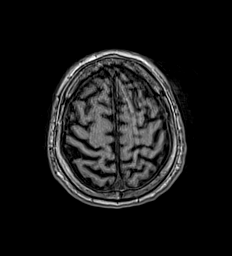
[im 160/160]
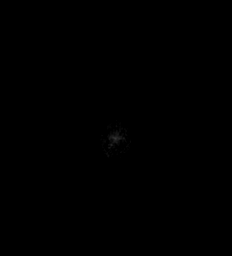

[Series 17: T2 · coronal · 5.0mm · 0.57mm/px · 2 of 29 slices shown (2 of 2)]
[im 1/29]
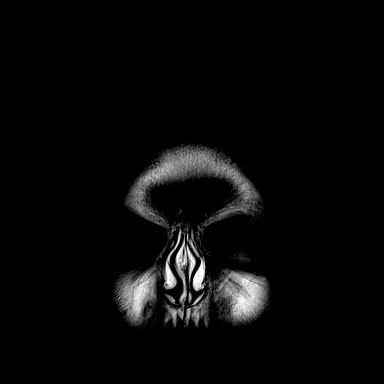
[im 29/29]
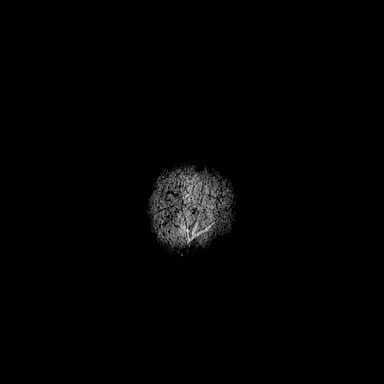

[47 of 48 positions shown; findings below may reference images not displayed]

FINDINGS: Motion artifact is present.

Brain: There is no acute infarction or intracranial hemorrhage.
There is no intracranial mass, mass effect, or edema. There is no
hydrocephalus or extra-axial fluid collection.

Patchy and mildly confluent areas of T2 hyperintensity in the
supratentorial and pontine white matter are nonspecific but may
reflect mild to moderate chronic microvascular ischemic changes.
There are chronic hemorrhagic infarcts of the left basal ganglia and
subinsular white matter and left pons. Right corona
radiata/posterior lentiform nucleus. Prominence of the ventricles
and sulci reflects mild parenchymal volume loss.

Vascular: Major vessel flow voids at the skull base are preserved.

Skull and upper cervical spine: Normal marrow signal is preserved.

Sinuses/Orbits: Paranasal sinuses are aerated. Orbits are
unremarkable.

Other: Sella is unremarkable.  Mastoid air cells are clear.
IMPRESSION: No acute infarction, hemorrhage, or mass.

Chronic infarcts and chronic microvascular ischemic changes.

## 2023-02-19 ENCOUNTER — Other Ambulatory Visit: Payer: Self-pay | Admitting: Cardiovascular Disease

## 2023-02-19 NOTE — Telephone Encounter (Signed)
Prescription refill request for Eliquis received. Indication: PAF Last office visit: 10/29/22  Concha Se MD Scr: 0.8 on 01/09/23  Epic Age: 89 Weight: 83.1kg  Based on above findings Eliquis 5mg  twice daily is the appropriate dose.  Refill approved.

## 2023-02-19 NOTE — Telephone Encounter (Signed)
Refill request

## 2023-08-19 ENCOUNTER — Other Ambulatory Visit: Payer: Self-pay | Admitting: Cardiovascular Disease

## 2023-08-19 NOTE — Telephone Encounter (Signed)
 Refill request

## 2023-08-19 NOTE — Telephone Encounter (Signed)
 Prescription refill request for Eliquis received. Indication: PAF Last office visit: 10/29/22  Concha Se MD Scr: 0.7 on 07/15/23  Epic Age: 89 Weight: 83.1kg  Based on above findings Eliquis 5mg  twice daily is the appropriate dose.  Refill approved.

## 2024-03-18 ENCOUNTER — Other Ambulatory Visit: Payer: Self-pay | Admitting: Cardiovascular Disease

## 2024-03-18 NOTE — Telephone Encounter (Signed)
 Prescription refill request for Eliquis  received. Indication:afib Last office visit:needs appt Scr: Age:  Weight:

## 2024-03-19 ENCOUNTER — Encounter: Payer: Self-pay | Admitting: Nurse Practitioner

## 2024-03-19 ENCOUNTER — Ambulatory Visit: Attending: Nurse Practitioner | Admitting: Nurse Practitioner

## 2024-03-19 ENCOUNTER — Ambulatory Visit: Attending: Nurse Practitioner

## 2024-03-19 VITALS — BP 118/64 | HR 54 | Wt 178.0 lb

## 2024-03-19 DIAGNOSIS — I480 Paroxysmal atrial fibrillation: Secondary | ICD-10-CM

## 2024-03-19 DIAGNOSIS — E782 Mixed hyperlipidemia: Secondary | ICD-10-CM

## 2024-03-19 DIAGNOSIS — I350 Nonrheumatic aortic (valve) stenosis: Secondary | ICD-10-CM

## 2024-03-19 DIAGNOSIS — I10 Essential (primary) hypertension: Secondary | ICD-10-CM

## 2024-03-19 DIAGNOSIS — I441 Atrioventricular block, second degree: Secondary | ICD-10-CM | POA: Diagnosis not present

## 2024-03-19 DIAGNOSIS — R001 Bradycardia, unspecified: Secondary | ICD-10-CM | POA: Diagnosis not present

## 2024-03-19 NOTE — Progress Notes (Addendum)
 Office Visit    Patient Name: Carlos Frey Date of Encounter: 03/19/2024  Primary Care Provider:  Diedra Lame, MD Primary Cardiologist:  Carlos Lunger, MD    Chief Complaint    89 y.o. male with a PMH of hypertension, hyperlipidemia, diastolic dysfunction, moderate aortic stenosis, sinus bradycardia, Mobitz 1 Heart block, TIA, strokes, GERD with Barrett's esophagus, and PAF who presents for 12 month follow up.   Past Medical History   Subjective   Past Medical History:  Diagnosis Date   AV block, Mobitz 1    a. 12/2021 Zio: 56 pauses, longest of which 8.6 seconds.  Most occurred throughout periods of sleep or early morning hours.   Barrett esophagus    Chest pain    a. 12/2021 MV: EF 55-65%. No ischemia/infarct. Low risk study. CT imaging w/ Ao and Cor Ca2+.   Diastolic dysfunction    GERD (gastroesophageal reflux disease)    Hematuria    a. 12/2021 in setting of DAPT-->plavix  d/c'd.   Hyperlipidemia LDL goal <70    Hypertension    Left carotid bruit    a. 12/2021 MRA neck: No hemodynamically significant carotid arterial stenoses.   Moderate aortic stenosis    a. 11/2021 Echo: EF 55-60%, no rwma, GRI DD, nl RV fxn, mildly dil LA. Mod AS; b. 12/2022 Echo: EF 55-60%, no rwma, sev asymm LVH of basal-septal segment. GrII DD. Nl RV vxn. Sev dil LA. Mild MR. Mild AI. Mild-mod AS.   PAF (paroxysmal atrial fibrillation) (HCC)    a. 12/2021 Zio: Predominantly sinus rhythm at 62 bpm.  5 brief runs of nonsustained VT 56 pauses, longest of which lasted 8.6 seconds.  Mobitz 1 heart block noted.  Brief episode of atrial fibrillation-->Eliquis  started.   Sinus bradycardia    TIA (transient ischemic attack)    Past Surgical History:  Procedure Laterality Date   APPENDECTOMY     COLONOSCOPY  08/08/03, 12/05/08 & 12/14/13   ESOPHAGOGASTRODUODENOSCOPY  10/09/09, 12/25/09 & 03/24/12   ESOPHAGOGASTRODUODENOSCOPY (EGD) WITH PROPOFOL  N/A 07/17/2017   Procedure: ESOPHAGOGASTRODUODENOSCOPY (EGD)  WITH PROPOFOL ;  Surgeon: Carlos Gladis PENNER, MD;  [facility] ENDOSCOPY;  Service: Endoscopy;  Laterality: N/A;   TONSILLECTOMY      Allergies  No Known Allergies     History of Present Illness      89 y.o. y/o male a PMH of hypertension, hyperlipidemia, diastolic dysfunction, moderate aortic stenosis, sinus bradycardia, Mobitz 1 Heart block, TIA, strokes, GERD with Barrett's esophagus, PAF. He was previously evaluated by our team in October 2019 in the setting of near syncope with sinus bradycardia and Mobitz 1 heart block. Echocardiogram at that time showed normal LV function with grade 1 diastolic dysfunction, and mild aortic stenosis. He was felt to be dehydrated and outpatient diuretic therapy was discontinued. In the setting of unremarkable work-up, he did not require any cardiology follow-up.   In June 2023, he presented to the emergency department with confusion and anorexia at which time they were found to be bradycardic with presence of Mobitz type 1 heart block. MRI showed old lacunar infarct. He was seen by neurology and placed on aspirin  and plavix . In July 2023 he was seen in ED with left-sided chest discomfort accompanied by right-sided numbness and difficulty finding words.  Repeat MRI of the brain continue to show remote lacunar infarcts of the left external capsule with chronic microvascular ischemic changes. He followed up in cardiology clinic 1 week later at which time he reported intermittent episodes  of focal, left lower chest pain without associated symptoms.  Myoview  was completed shortly after which was low risk without ischemia or infarct. CT imaging showed aortic and coronary atherosclerosis. Zio monitor showed a brief episode of PAF for which he was placed on eliquis  as well as 5 brief runs of NSVT and 56 pauses, the longest being 8.6 seconds. Pauses most often occurred in the middle of the night or early morning hours. Echocardiogram in July 2024 showed LVEF 55%-60%, no  RWMA, severe asymmetric LVH of basal-septal segment, Grade 11 DD, mildly enlarged right ventricle, severely dilated left atrium, mild mitral regurg, mild aortic regurg, mild-moderate aortic stenosis.   Carlos Frey was last seen in cardiology clinic in 10/2022. He reports diarrhea x2 years which he attributes to starting Eliquis . He has tried xarelto  and pradaxa  but had similar side effects. At his most recent PCP visit he was switched from amlodipine  10 mg to 5 mg and started on hydrochlorothiazide  12.5 mg due to lower extremity edema suspected to be a side effect of amlodipine . He has not had edema since medication changes were made. He denies palpitations, dyspnea, pnd, orthopnea, n, v, dizziness, syncope, edema, weight gain, or early satiety. He does report infrequent precordial pain which he describes as an ache that is only present sometimes when lying down on his left side, has no associated symptoms and disappears immediately upon changing position. He stays physically active by doing yard work often.      Objective   Home Medications    Current Outpatient Medications  Medication Sig Dispense Refill   amLODipine  (NORVASC ) 10 MG tablet Take 1 tablet (10 mg total) by mouth daily. (Patient taking differently: Take 5 mg by mouth daily.) 180 tablet 3   apixaban  (ELIQUIS ) 5 MG TABS tablet Take 1 tablet (5 mg total) by mouth 2 (two) times daily. NEEDS CARDIOLOGY APPT FOR REFILLS, CALL OFFICE TO SCHEDULE.  THANK YOU 60 tablet 0   atorvastatin  (LIPITOR) 40 MG tablet Take 1 tablet (40 mg total) by mouth daily. 30 tablet 0   cetirizine (ZYRTEC) 10 MG tablet Take 10 mg by mouth daily.     citalopram  (CELEXA ) 10 MG tablet Take 10 mg by mouth daily.     lisinopril -hydrochlorothiazide  (ZESTORETIC ) 20-12.5 MG tablet Take 1 tablet by mouth daily.     omeprazole (PRILOSEC) 40 MG capsule Take 40 mg by mouth daily.     No current facility-administered medications for this visit.     Physical Exam    VS:  BP  118/64 (BP Location: Left Arm, Patient Position: Sitting, Cuff Size: Normal)   Pulse (!) 54   Wt 178 lb (80.7 kg)   SpO2 98%   BMI 25.18 kg/m  , BMI Body mass index is 25.18 kg/m.          GEN: Well nourished, well developed, in no acute distress. HEENT: normal. Neck: Supple, no JVD, carotid bruits, or masses. Radiating murmur noted bilaterally. Cardiac: RRR, no rubs, or gallops. Grade 3/6 systolic murmur best heard at RUSB. No clubbing, cyanosis, edema.  Radials 2+/PT 2+ and equal bilaterally.  Respiratory:  Respirations regular and unlabored, clear to auscultation bilaterally. GI: Soft, nontender, nondistended, BS + x 4. MS: no deformity or atrophy. Skin: warm and dry, no rash. Neuro:  Strength and sensation are intact. Psych: Normal affect.  Accessory Clinical Findings    ECG personally reviewed by me today - EKG Interpretation Date/Time:  Friday March 19 2024 14:29:53 EDT Ventricular Rate:  54  PR Interval:  444-530 QRS Duration:  84 QT Interval:  430 QTC Calculation: 407 R Axis:   -18  Text Interpretation: Sinus bradycardia with 1st degree A-V block, mobitz I Confirmed by Vivienne Bruckner [phone]) on 03/19/2024 2:50:14 PM  - no acute changes.  12 lead rhythm strip: predominantly sinus with 1st deg avb followed by brief run of mobitz II and then mobitz I.  Labs dated January 20, 2024 Care Everywhere:  Hemoglobin 13.1, hematocrit 39.0, WBC 6.2, platelets 171 Sodium 139, potassium 4.1, chloride 103, CO2 30.8, BUN 15, creatinine 0.7, glucose 92 Calcium  9.0, albumin 3.9, total protein 6.5 Total bilirubin 1.1, alkaline phosphatase 73, AST 18, ALT 9 Hemoglobin A1c 5.7 Total cholesterol 124, triglycerides 119, HDL 23.6, LDL 77    Assessment & Plan    1.  Sinus brady/1st deg AVB/Mobitz I/II -Sinus bradycardia with significant 1st degree AV block on EKG w/ PR of 480 - 530 w/ evidence of mobitz I -Rhythm strip w/ Mobitz I and some evidence of Mobitz II -HR increases to  75 bpm w/ ambulation -asymptomatic -Cont to avoid AVN blocking agents -Zio monitor x 3 days to assess for higher grade HB -Consider EP referral pending Zio - has seen Fernande previously  2.  PAF -Prev dx on event monitoring in 2023 -Maintaining sinus rhythm today w/ evidence of AVB as outlined above -Cont to avoid AVN blocking agents. -OAC w/ apixaban  - ? Contribution  to longstanding diarrhea  -stable labs in 01/2024  3.  Moderate Aortic Stenosis  -Moderate aortic stenosis noted on previous echo in 2024 -Grade 3/6 systolic murmur best heard at RUSB and radiates to carotids -f/u echo  4. Hypertension -Well controlled -Continue lisinopril , amlodipine , HCTZ  5. Hyperlipidemia -LDL in 01/2024 of 77 -Continue Atorvastatin   6. Prediabetes - A1C in 01/2024 of 5.7%  -Encouraged continuation of healthy eating and frequent exercise  7. Disposition -Follow up echo and Zio -f/u in clinic in 4 wks  Bruckner Vivienne, NP 03/19/2024, 5:12 PM

## 2024-03-19 NOTE — Patient Instructions (Signed)
 Medication Instructions:   Your physician recommends that you continue on your current medications as directed. Please refer to the Current Medication list given to you today.    *If you need a refill on your cardiac medications before your next appointment, please call your pharmacy*  Lab Work:  None ordered at this time   If you have labs (blood work) drawn today and your tests are completely normal, you will receive your results only by:  MyChart Message (if you have MyChart) OR  A paper copy in the mail If you have any lab test that is abnormal or we need to change your treatment, we will call you to review the results.  Testing/Procedures:  Your physician has requested that you have an echocardiogram. Echocardiography is a painless test that uses sound waves to create images of your heart. It provides your doctor with information about the size and shape of your heart and how well your heart's chambers and valves are working.   You may receive an ultrasound enhancing agent through an IV if needed to better visualize your heart during the echo. This procedure takes approximately one hour.  There are no restrictions for this procedure.  This will take place at [address] [facility] [address], [place] [address]  Please note: We ask at that you not bring children with you during ultrasound (echo/ vascular) testing. Due to room size and safety concerns, children are not allowed in the ultrasound rooms during exams. Our front office staff cannot provide observation of children in our lobby area while testing is being conducted. An adult accompanying a patient to their appointment will only be allowed in the ultrasound room at the discretion of the ultrasound technician under special circumstances. We apologize for any inconvenience.   ZIO AT Long term monitor-Live Telemetry  Your physician has requested you wear a ZIO patch monitor for 3 days.  This is a single patch  monitor. [employer] supplies one patch monitor per enrollment.  Additional stickers are not available.  Please do not apply patch if you will be having a Nuclear Stress Test, Echocardiogram, Cardiac CT, MRI, or Chest Xray during the period you would be wearing the monitor.  The patch cannot be worn during these tests.  You cannot remove and re-apply the ZIO AT patch monitor.  Your ZIO patch monitor will be mailed 3 day USPS to your address on file. It may take 3-5 days to receive your monitor after you have been enrolled.  Once you have received your monitor, please review the enclosed instructions. Your monitor has already been registered assigning a specific monitor serial number to you.   Billing and Patient Assistance Program information  [facility] has been supplied with any insurance information on record for billing.  [employer] offers a sliding scale Patient Assistance Program for patients without insurance, or whose insurance does not completely cover the cost of the ZIO patch monitor.  You must apply for the Patient Assistance Program to qualify for the discounted rate.  To apply, call [employer] at [phone], select option 4, select option 2 , ask to apply for the Patient Assistance Program, (you can request an interpreter if needed).  [employer] will ask your household income and how many people are in your household.  [employer] will quote your out-of-pocket cost based on this information. They will also be able to set up a 12 month interest free payment plan if needed.  Applying the monitor   Shave hair from upper  left chest.  Hold the abrader disc by orange tab. Rub the abrader in 40 strokes over left upper chest as indicated in your monitor instructions.  Clean area with 4 enclosed alcohol pads. Use all pads to ensure the area is cleaned thoroughly. Let dry.  Apply patch as indicated in monitor instructions. Patch will be placed under collarbone on left side of chest with arrow pointing upward.  Rub  patch adhesive wings for 2 minutes. Remove the white label marked 1. Remove the white label marked 2. Rub patch adhesive wings for 2 additional minutes.  While looking in a mirror, press and release button in center of patch. A small green light will flash 3-4 times.  This will be your only indicator that the monitor has been turned on.   Do not shower for the first 24 hours.  You may shower after the first 24 hours.  Press the button if you feel a symptom.  You will hear a small click.  Record Date, Time and Symptom in the Patient Log.   Starting the Gateway  In your kit there is a Audiological scientist box the size of a cellphone.  This is Buyer, retail. It transmits all your recorded data to [facility].  This box must always stay within 10 feet of you.  Open the box and push the * button.  There will be a light that blinks orange and then green a few times.  When the light stops blinking, the Gateway is connected to the ZIO patch. Call [employer] at [phone] to confirm your monitor is transmitting.  Returning your monitor  Remove your patch and place it inside the Gateway.  In the lower half of the Gateway box there is a white bag with prepaid postage on it.  Place Gateway in bag and seal.  Mail package back to [place] as soon as possible.  Your physician should have your final report approximately 7-14 days after you have mailed back your monitor. Call [facility] Customer Care at [phone] if you have questions regarding your ZIO AT patch monitor.   Call them immediately if you see an orange light blinking on your monitor.  If your monitor falls off in less than 4 days, contact our Monitor department at [phone]. If your monitor becomes loose or falls off after 4 days call [employer] at [phone] for suggestions on securing your monitor.   Referrals:  None ordered at this time   Follow-Up:  At [facility], you and your health needs are our priority.  As part of our  continuing mission to provide you with exceptional heart care, our providers are all part of one team.  This team includes your primary Cardiologist (physician) and Advanced Practice Providers or APPs (Physician Assistants and Nurse Practitioners) who all work together to provide you with the care you need, when you need it.  Your next appointment:   1 month(s)  Provider:    Timothy Gollan, MD or Lonni Meager, NP    We recommend signing up for the patient portal called MyChart.  Sign up information is provided on this After Visit Summary.  MyChart is used to connect with patients for Virtual Visits (Telemedicine).  Patients are able to view lab/test results, encounter notes, upcoming appointments, etc.  Non-urgent messages can be sent to your provider as well.   To learn more about what you can do with MyChart, go to [email or web address].

## 2024-03-24 ENCOUNTER — Telehealth: Payer: Self-pay | Admitting: Nurse Practitioner

## 2024-03-24 NOTE — Telephone Encounter (Signed)
  1. Is this related to a heart monitor you are wearing?  (If the patient says no, please ask     if they are caling about ICD/pacemaker.) Heart Monitor  2. What is your issue?? Pt would like someone to call him to go over instructions about putting monitor on   Please route to covering RN/CMA/RMA for results. Route to monitor technicians or your monitor tech representative for your site for any technical concerns

## 2024-03-24 NOTE — Telephone Encounter (Signed)
 Called patient regarding zio monitor instructions. Patient states that since calling in, he had found the instructions for how to put on the zio monitor and he was going to put it on Friday morning and would call back if he needed any help on that day but he thinks he is fine now since finding the instructions.

## 2024-03-26 ENCOUNTER — Telehealth: Payer: Self-pay | Admitting: Cardiovascular Disease

## 2024-03-26 DIAGNOSIS — R001 Bradycardia, unspecified: Secondary | ICD-10-CM

## 2024-03-26 DIAGNOSIS — I441 Atrioventricular block, second degree: Secondary | ICD-10-CM

## 2024-03-26 NOTE — Telephone Encounter (Signed)
 Caller Forensic psychologist) is reporting critical results.

## 2024-03-26 NOTE — Telephone Encounter (Signed)
 Received order from Dr. Darliss to order EP consult for first available and to notify patient that if he has any symptoms to be evaluated in the ED. Called and spoke with patient. Patient states that he feels great and has not had any symptoms. Patient instructed to go to the ED if he does develop symptoms. Patient also notified that a consult is being placed for EP referral. Patient verbalizes understanding.

## 2024-03-26 NOTE — Telephone Encounter (Signed)
 Accepted and spoke with Danielle from [facility] on the STAT phone.  Danielle reported patient had an episode of Complete Heart Block with heart rate of 34-36 that occurred at 12:48 pm today. Message routed to Dr. Gollan, Dr. Agbor-Etang, (DOD) and Suzann Riddle, NP.  Also spoke with Suzann in person.

## 2024-03-29 ENCOUNTER — Telehealth: Payer: Self-pay | Admitting: Cardiovascular Disease

## 2024-03-29 ENCOUNTER — Encounter: Payer: Self-pay | Admitting: Nurse Practitioner

## 2024-03-29 NOTE — Telephone Encounter (Signed)
 [facility] called about abnormal EKG with 10.9 second heart block occurring 03/29/2024 at 0703. Called patient and patient reports feeling fine, he does not know when these pauses happen. But he appreciated the call and will forward information to primary cardiologist and consulting EP.

## 2024-03-29 NOTE — Telephone Encounter (Signed)
 [employer] calling with abnormal EKG, ref # [id number]

## 2024-03-29 NOTE — Telephone Encounter (Signed)
 Left a message for the patient to call back to see if he would travel to [place] to see one of the EP doctors.

## 2024-03-29 NOTE — Telephone Encounter (Signed)
 If EP MD appt isn't available in [place], is something available in [place] or w/ EP APP on a day that MD available?  Need EP MD input.

## 2024-03-29 NOTE — Telephone Encounter (Signed)
 Called the patient to see if he could come to an appointment tomorrow at 10 am but he stated that he cannot come to an appointment before noon.  He stated that he could not go to [facility] for an appointment. He has been advised that we would call him if something sooner came up and has been advised to call if he becomes symptomatic.

## 2024-04-04 NOTE — Progress Notes (Unsigned)
 Cardiology Office Note  Date:  04/05/2024   ID:  Carlos Frey, DOB 1929-12-14, MRN [id number]  PCP:  Diedra Lame, MD   Chief Complaint  Patient presents with   Zio follow up     Patient denies chest pain or shortness of breath.     HPI:  [name] is a 89 year old male with above past medical history hypertension,  hyperlipidemia,  diastolic dysfunction,  moderate aortic stenosis, June 2023 mean gradient 21 sinus bradycardia, Mobitz 1 heart block,  TIA, strokes GERD, and Barrett's esophagus.  Paroxysmal atrial fibrillationNear syncope 2019 in the setting of sinus bradycardia, felt to be dehydrated, outpatient diuretic held Significant first-degree AV block Who presents for routine follow-up for his atrial fibrillation, history of strokes  Last seen by myself in clinic 5/24 Seen by one of our providers October 2025  For significant first-degree AV block, Zio monitor ordered Results are still pending 1 rhythm strips sent into our office detailing junctional escape rhythm with rates in the 34-38 range at 12:48 PM He reports at that time he was asymptomatic Naps a lot in the day, also frequent trips to the bathroom with bowel movements which are loose  Denies any near-syncope or syncope No lightheadedness, no spells Reports he has good energy, good ADLs Denies shortness of breath on exertion Reports he has good exercise tolerance  Followed by EP, Scheduled to see EP in 1 month  Taking Eliquis  5 twice daily  Weight stable  EKG personally reviewed by myself on todays visit EKG Interpretation Date/Time:  Monday April 05 2024 14:56:40 EDT Ventricular Rate:  55 PR Interval:  464 QRS Duration:  82 QT Interval:  426 QTC Calculation: 407 R Axis:   -16  Text Interpretation: Sinus bradycardia with 1st degree A-V block with Premature supraventricular complexes When compared with ECG of 19-Mar-2024 14:29, No significant change was found Confirmed by  Perla Lye ([id number]) on 04/05/2024 3:16:19 PM    Other past medical history reviewed June 2023, emergency department with confusion and anorexia.  bradycardic with presence of Mobitz 1 heart block.   echocardiogram showed EF of 55 to 60% with grade 1 diastolic dysfunction and moderate aortic stenosis.   MRI was performed and showed old lacunar infarct without acute findings.   seen by neurology and placed on aspirin  and Plavix .    emergency department December 08 2021 with hematuria and Plavix  was discontinued.   CT urogram was reassuring and he was cleared to resume Plavix  but then had recurrent hematuria and this was ultimately discontinued.    On December 20, 2021, he was seen in the emergency department with left-sided chest discomfort and right-sided numbness, as well as difficulty finding his words.  EKG showed sinus bradycardia with first-degree AV block and intermittent Mobitz 1 heart block.  Labs were unremarkable.  Repeat MRI of the brain continue to show remote lacunar infarcts of the left external capsule with chronic microvascular ischemic changes.  He was discharged home and advised to follow-up with cardiology.  At office follow-up on July 17, he reported intermittent and often prolonged episodes of focal, left lower chest aching without associated symptoms, lasting hours at a time and resolving after drinking something or ambulating.  He also reported that previously ordered ZIO monitor was too difficult to place and therefore he returned it.    Lexiscan  Myoview  in the setting of chest discomfort and this was low risk without ischemia or infarct.   CT imaging did show aortic and coronary  atherosclerosis.  Zio monitor, which showed a brief episode of paroxysmal atrial fibrillation and he was placed on Eliquis .   5 brief runs of nonsustained VT and 56 pauses, the longest of which lasted 8.6 seconds.  Pauses were frequently occurring in the middle of the night or in the early morning  hours.   PMH:   has a past medical history of AV block, Mobitz 1, Barrett esophagus, Chest pain, Diastolic dysfunction, GERD (gastroesophageal reflux disease), Hematuria, Hyperlipidemia LDL goal <70, Hypertension, Left carotid bruit, Moderate aortic stenosis, PAF (paroxysmal atrial fibrillation) (HCC), Sinus bradycardia, and TIA (transient ischemic attack).  PSH:    Past Surgical History:  Procedure Laterality Date   APPENDECTOMY     COLONOSCOPY  08/08/03, 12/05/08 & 12/14/13   ESOPHAGOGASTRODUODENOSCOPY  10/09/09, 12/25/09 & 03/24/12   ESOPHAGOGASTRODUODENOSCOPY (EGD) WITH PROPOFOL  N/A 07/17/2017   Procedure: ESOPHAGOGASTRODUODENOSCOPY (EGD) WITH PROPOFOL ;  Surgeon: Gaylyn Gladis PENNER, MD;  Location: [facility] ENDOSCOPY;  Service: Endoscopy;  Laterality: N/A;   TONSILLECTOMY      Current Outpatient Medications  Medication Sig Dispense Refill   amLODipine  (NORVASC ) 5 MG tablet Take 5 mg by mouth daily.     apixaban  (ELIQUIS ) 5 MG TABS tablet Take 1 tablet (5 mg total) by mouth 2 (two) times daily. NEEDS CARDIOLOGY APPT FOR REFILLS, CALL OFFICE TO SCHEDULE.  THANK YOU 60 tablet 0   atorvastatin  (LIPITOR) 40 MG tablet Take 1 tablet (40 mg total) by mouth daily. 30 tablet 0   cetirizine (ZYRTEC) 10 MG tablet Take 10 mg by mouth daily.     citalopram  (CELEXA ) 10 MG tablet Take 10 mg by mouth daily.     Cod Liver Oil CAPS Take by mouth daily.     lisinopril -hydrochlorothiazide  (ZESTORETIC ) 20-12.5 MG tablet Take 1 tablet by mouth daily.     omeprazole (PRILOSEC) 40 MG capsule Take 40 mg by mouth daily.     No current facility-administered medications for this visit.   Allergies:   Patient has no known allergies.   Social History:  The patient  reports that he quit smoking about 60 years ago. His smoking use included pipe and cigars. He has never used smokeless tobacco. He reports that he does not drink alcohol and does not use drugs.   Family History:   family history includes Diabetes in his father.     Review of Systems: Review of Systems  Constitutional: Negative.   HENT: Negative.    Respiratory: Negative.    Cardiovascular: Negative.   Gastrointestinal: Negative.   Musculoskeletal: Negative.   Neurological: Negative.   Psychiatric/Behavioral: Negative.    All other systems reviewed and are negative.   PHYSICAL EXAM: VS:  BP (!) 140/60 (BP Location: Left Arm, Patient Position: Sitting, Cuff Size: Normal)   Pulse (!) 55   Ht 5' 10 (1.778 m)   Wt 179 lb 8 oz (81.4 kg)   SpO2 93%   BMI 25.76 kg/m  , BMI Body mass index is 25.76 kg/m. Constitutional:  oriented to person, place, and time. No distress.  HENT:  Head: Grossly normal Eyes:  no discharge. No scleral icterus.  Neck: No JVD, no carotid bruits  Cardiovascular: Regular rate and rhythm, no murmurs appreciated Pulmonary/Chest: Clear to auscultation bilaterally, no wheezes or rales Abdominal: Soft.  no distension.  no tenderness.  Musculoskeletal: Normal range of motion Neurological:  normal muscle tone. Coordination normal. No atrophy Skin: Skin warm and dry Psychiatric: normal affect, pleasant   Recent Labs: No results found for requested  labs within last 365 days.    Lipid Panel Lab Results  Component Value Date   CHOL 127 12/09/2021   HDL 28 (L) 12/09/2021   LDLCALC 81 12/09/2021   TRIG 90 12/09/2021      Wt Readings from Last 3 Encounters:  04/05/24 179 lb 8 oz (81.4 kg)  03/19/24 178 lb (80.7 kg)  10/29/22 183 lb 2 oz (83.1 kg)     ASSESSMENT AND PLAN:  Problem List Items Addressed This Visit       Cardiology Problems   AV block, Mobitz 1   Relevant Medications   amLODipine  (NORVASC ) 5 MG tablet   Essential hypertension   Relevant Medications   amLODipine  (NORVASC ) 5 MG tablet   TIA (transient ischemic attack)   Relevant Medications   amLODipine  (NORVASC ) 5 MG tablet   Other Visit Diagnoses       Heart block AV second degree    -  Primary   Relevant Medications   amLODipine   (NORVASC ) 5 MG tablet   Other Relevant Orders   EKG 12-Lead (Completed)     Sinus bradycardia       Relevant Medications   amLODipine  (NORVASC ) 5 MG tablet   Other Relevant Orders   EKG 12-Lead (Completed)     Paroxysmal atrial fibrillation (HCC)       Relevant Medications   amLODipine  (NORVASC ) 5 MG tablet   Other Relevant Orders   EKG 12-Lead (Completed)     Moderate aortic stenosis       Relevant Medications   amLODipine  (NORVASC ) 5 MG tablet   Other Relevant Orders   EKG 12-Lead (Completed)     Mixed hyperlipidemia       Relevant Medications   amLODipine  (NORVASC ) 5 MG tablet     Hyperlipidemia LDL goal <70       Relevant Medications   amLODipine  (NORVASC ) 5 MG tablet     Aortic valve stenosis, etiology of cardiac valve disease unspecified       Relevant Medications   amLODipine  (NORVASC ) 5 MG tablet      Junctional bradycardia/significant first-degree AV block Asymptomatic, results of Zio monitor pending 1 rhythm strip showing junctional bradycardia rate 34-38, reports he was asymptomatic, may have been sleeping or having bowel movement Scheduled to see EP next month Recommended we call him with results of Zio monitor once available Suggested he call our office for any lightheadedness, near-syncope or syncope episodes  Paroxysmal atrial fibrillation Maintaining normal sinus rhythm, not on rate controlling medication secondary to history of bradycardia side effects from Xarelto , Pradaxa  Able to tolerate Eliquis  5 twice daily though concerned this may give him GI issues  History of TIA On Eliquis  5 twice daily given history of paroxysmal atrial fibrillation  Moderate aortic valve stenosis Last evaluated June 2023 mean gradient of 20 Repeat echo pending next month  Essential hypertension Blood pressure is well controlled on today's visit. No changes made to the medications.  Hyperlipidemia Cholesterol is at goal on the current lipid regimen. No changes to the  medications were made.   Signed, Velinda Lunger, M.D., Ph.D. [facility] [place], [place] [phone]

## 2024-04-05 ENCOUNTER — Encounter: Payer: Self-pay | Admitting: Cardiovascular Disease

## 2024-04-05 ENCOUNTER — Ambulatory Visit: Attending: Cardiovascular Disease | Admitting: Cardiovascular Disease

## 2024-04-05 VITALS — BP 140/60 | HR 55 | Ht 70.0 in | Wt 179.5 lb

## 2024-04-05 DIAGNOSIS — I480 Paroxysmal atrial fibrillation: Secondary | ICD-10-CM

## 2024-04-05 DIAGNOSIS — R001 Bradycardia, unspecified: Secondary | ICD-10-CM | POA: Diagnosis not present

## 2024-04-05 DIAGNOSIS — I350 Nonrheumatic aortic (valve) stenosis: Secondary | ICD-10-CM

## 2024-04-05 DIAGNOSIS — E782 Mixed hyperlipidemia: Secondary | ICD-10-CM

## 2024-04-05 DIAGNOSIS — I441 Atrioventricular block, second degree: Secondary | ICD-10-CM | POA: Diagnosis not present

## 2024-04-05 DIAGNOSIS — E785 Hyperlipidemia, unspecified: Secondary | ICD-10-CM

## 2024-04-05 DIAGNOSIS — G459 Transient cerebral ischemic attack, unspecified: Secondary | ICD-10-CM

## 2024-04-05 DIAGNOSIS — I10 Essential (primary) hypertension: Secondary | ICD-10-CM

## 2024-04-05 MED ORDER — AMLODIPINE BESYLATE 5 MG PO TABS: 5.0000 mg | ORAL_TABLET | Freq: Every day | ORAL | 3 refills | Status: AC

## 2024-04-05 MED ORDER — ATORVASTATIN CALCIUM 40 MG PO TABS: 40.0000 mg | ORAL_TABLET | Freq: Every day | ORAL | 3 refills | Status: AC

## 2024-04-05 MED ORDER — APIXABAN 5 MG PO TABS: 5.0000 mg | ORAL_TABLET | Freq: Two times a day (BID) | ORAL | 3 refills | Status: AC

## 2024-04-05 MED ORDER — LISINOPRIL-HYDROCHLOROTHIAZIDE 20-12.5 MG PO TABS: 1.0000 | ORAL_TABLET | Freq: Every day | ORAL | 3 refills | Status: AC

## 2024-04-05 NOTE — Patient Instructions (Addendum)
 Medication Instructions:  ?No changes ? ?If you need a refill on your cardiac medications before your next appointment, please call your pharmacy.  ? ?Lab work: ?No new labs needed ? ?Testing/Procedures: ?No new testing needed ? ?Follow-Up: ?At [facility], you and your health needs are our priority.  As part of our continuing mission to provide you with exceptional heart care, we have created designated Provider Care Teams.  These Care Teams include your primary Cardiologist (physician) and Advanced Practice Providers (APPs -  Physician Assistants and Nurse Practitioners) who all work together to provide you with the care you need, when you need it. ? ?You will need a follow up appointment in 6 months, APP ok ? ?Providers on your designated Care Team:   ?Nicolasa Ducking, NP ?Eula Listen, PA-C ?Cadence Fransico Michael, PA-C ? ?COVID-19 Vaccine Information can be found at: [email or web address] For questions related to vaccine distribution or appointments, please email [email or web address] or call [phone].  ? ?

## 2024-04-09 ENCOUNTER — Telehealth: Payer: Self-pay | Admitting: Cardiovascular Disease

## 2024-04-09 NOTE — Telephone Encounter (Signed)
 Daniel with [facility] called regarding pt's ZIO heart monitor; states that pt's final report has been posted and there was a correction to a previous called alert:  10/17 12:45pm and 10/20 7:03pm where labeled as complete heart block, but after receiving the full recording from the monitor, that rhythm was changed to Mobitz Type I with blocked PACs with junctional skipped beats.   Thanked me for returning his phone call.  In reviewing pt's chart, pt had an office Visit with Dr. Gollan on 10/27 and zio heart monitor results were reviewed with pt and referred to EP next month.

## 2024-04-09 NOTE — Telephone Encounter (Signed)
 [employer] calling with abnormal reading for pt. Please advise. Attempted to transfer, no answer.   Call back [phone] Reference # [id number]

## 2024-04-11 DIAGNOSIS — I441 Atrioventricular block, second degree: Secondary | ICD-10-CM | POA: Diagnosis not present

## 2024-04-12 ENCOUNTER — Ambulatory Visit: Payer: Self-pay | Admitting: Nurse Practitioner

## 2024-04-12 NOTE — Telephone Encounter (Signed)
 Wife Mateo) returned RN's call regarding results.

## 2024-04-21 ENCOUNTER — Ambulatory Visit: Admitting: Nurse Practitioner

## 2024-05-04 ENCOUNTER — Ambulatory Visit: Attending: Nurse Practitioner

## 2024-05-04 DIAGNOSIS — I350 Nonrheumatic aortic (valve) stenosis: Secondary | ICD-10-CM

## 2024-05-04 LAB — ECHOCARDIOGRAM COMPLETE
AR max vel: 0.98 cm2
AV Area VTI: 1.08 cm2
AV Area mean vel: 1.04 cm2
AV Mean grad: 23 mmHg
AV Peak grad: 42.8 mmHg
Ao pk vel: 3.27 m/s
Area-P 1/2: 2.62 cm2
S' Lateral: 2.6 cm

## 2024-05-04 NOTE — Progress Notes (Unsigned)
 Electrophysiology Clinic Note    Date:  05/05/2024  Patient ID:  Carlos, Frey [birth year], MRN [id number] PCP:  Diedra Lame, MD  Cardiologist:  Evalene Lunger, MD  Cardiology APP:  Vivienne Lonni Ingle, NP  Electrophysiology APP:  Blakeley Margraf, NP     Discussed the use of AI scribe software for clinical note transcription with the patient, who gave verbal consent to proceed.   Patient Profile    Chief Complaint: heart block  History of Present Illness: Carlos Frey is a 89 y.o. male with PMH notable for Mobitz 1 HB, Mobitz 2 HB, bradycardia, mod AS, parox AFib, CVA, HTN, HLD, ; seen today for EP consult of sinus pauses routine electrophysiology followup.   He was evaluated by Dr. Fernande 02/2022.  It was noted in that visit that he had a history of Mobitz 1 heart block since at least 2019. He was diagnosed with a CVA in 2023.  Ambulatory monitor revealed paroxysmal A-fib episodes and he was started on Eliquis  at that time. There was no clear connection to his abnormal conduction disease and symptoms, so pacemaker was not pursued.  It appeared that his High-grade heart block correlated with night time sleep or daytime naps.  There was some discussion of an implantable loop recorder to further evaluate for atrial fibrillation but this was not undertaken.   He was referred to pulmonology for further workup of his sleep apnea, but patient did not not agree to be seen.   He last saw PA Vivienne 03/2024 in clinic where EKG and rhythm strip demonstrated Mobitz 1 with some concern of Mobitz 2. Zio monitor ordered at that time that showed nocturnal high grade HB, so was referred to EP for further evaluation.   He tells me that he has noticed he doesn't have the energy he used to 20 years ago, but overall feels very well. He continues to take care of his yard and property,  mowing grass and weed-eating without limitations. He denies dizziness, LH, presyncope, or syncope  with activity or at rest. His only cardiac-related complaint is L-sided chest discomfort when laying on L side, relieved by rolling over onto R side. He continues to take eliquis  without bleeding concerns.      Arrhythmia/Device History No specialty comments available.    ROS:  Please see the history of present illness. All other systems are reviewed and otherwise negative.    Physical Exam    VS:  BP (!) 138/56   Pulse (!) 42   Ht 5' 10 (1.778 m)   Wt 180 lb 3.2 oz (81.7 kg)   SpO2 96%   BMI 25.86 kg/m  BMI: Body mass index is 25.86 kg/m.           Wt Readings from Last 3 Encounters:  05/05/24 180 lb 3.2 oz (81.7 kg)  04/05/24 179 lb 8 oz (81.4 kg)  03/19/24 178 lb (80.7 kg)     GEN- The patient is well appearing, alert and oriented x 3 today.   Lungs- Clear to ausculation bilaterally, normal work of breathing.  Heart- Regular rate and rhythm, no murmurs, rubs or gallops Extremities- No peripheral edema, warm, dry   Studies Reviewed   Previous EP, cardiology notes.    EKG is ordered. Personal review of EKG from today shows:    EKG Interpretation Date/Time:  Wednesday May 05 2024 14:39:24 EST Ventricular Rate:  42 PR Interval:    QRS Duration:  88 QT Interval:  434 QTC Calculation: 362 R Axis:   -22  Text Interpretation: Sinus bradycardia with sinus pauses with A-V dissociation and Junctional bradycardia Confirmed by Tahesha Skeet [phone]) on 05/05/2024 4:05:14 PM    TTE, 05/05/2024  1. Left ventricular ejection fraction, by estimation, is 60 to 65%. Left ventricular ejection fraction by PLAX is 81 %. The left ventricle has normal function. Left ventricular endocardial border not optimally defined to evaluate regional wall motion. Left ventricular diastolic parameters are consistent with Grade II diastolic dysfunction (pseudonormalization). The average left ventricular global longitudinal strain is -17.5 %. The global longitudinal strain is normal.   2.  Right ventricular systolic function is normal. The right ventricular size is mildly enlarged.   3. Left atrial size was moderately dilated.   4. The mitral valve is degenerative. Mild mitral valve regurgitation. No evidence of mitral stenosis. Moderate mitral annular calcification.   5. The aortic valve has an indeterminant number of cusps. There is severe  calcifcation of the aortic valve. Aortic valve regurgitation is mild. Moderate aortic valve stenosis. Aortic valve mean gradient measures 23.0 mmHg. Aortic valve Vmax measures 3.27 m/s. AVA 1.06. DVI 0.37 with SVI 46.   6. The inferior vena cava is normal in size with greater than 50% respiratory variability, suggesting right atrial pressure of 3 mmHg.   Comparison(s): A prior study was performed on 12/24/2022. Aortic valve peak  velocity and gradients measure higher on today's exam but severity is still in moderate range.   Long-term monitor, 04/07/2024 Patch Wear Time:  3 days and 0 hours (2025-10-17T09:25:07-398 to 2025-10-20T10:20:09-398)   Normal sinus rhythm Patient had a min HR of 21 bpm, max HR of 91 bpm, and avg HR of 54 bpm.    First Degree AV Block was present.    24 Pauses occurred, the longest lasting 3.9 secs (16 bpm). Pauses occurred due to Second Degree AV Block-Mobitz I (Wenckebach), Non-Conducted SVE(s) and possible Atrial Tachycardia with block. Second Degree AV Block-Mobitz I (Wenckebach) was present.    No Isolated SVEs, SVE Couplets, or SVE Triplets were present.    Isolated VEs were rare (<1.0%, 1555), VE Couplets were rare (<1.0%, 27), and VE Triplets were rare (<1.0%, 7). Ventricular Bigeminy and Trigeminy were present.   No patient triggered events recorded  Long-term monitor, 01/25/2022 Patch Wear Time:  14 days and 0 hours (2023-07-26T10:44:15-0400 to 2023-08-09T10:44:15-0400)   Normal sinus rhythm Patient had a min HR of 21 bpm, max HR of 218 bpm, and avg HR of 62 bpm.    Suspected atrial fibrillation  noted January 07, 2022 at 12:52 PM (was labeled as sinus on monitor), unclear duration as full strip not available but has been requested.  Additional information also requested from January 07, 2022 at 8:13 AM concerning for irregular rhythm, and January 04, 2022 6:55 PM   4 Ventricular Tachycardia runs occurred, the run with the fastest interval lasting 4 beats with a max rate of 218 bpm, the longest lasting 10 beats with an avg rate of 125 bpm.    56 Pauses occurred, the longest lasting 8.6 secs (7 bpm). (Longest pauses noted at 6:51 AM, 3:14 AM, 6:15 AM ,asymptomatic) Pause(s) occurred due to Second Degree AV Block-Mobitz I (Wenckebach), possible Atrial Tachycardia with block. and Non-Conducted SVE(s).    Second Degree AV Block-Mobitz I (Wenckebach) was present.    Isolated SVEs were rare (<1.0%), SVE Couplets were rare (<1.0%), and no SVE Triplets were present. Isolated VEs were rare (<1.0%, 9150), VE Couplets were  rare (<1.0%, 95), and VE Triplets were rare (<1.0%, 6). Ventricular Bigeminy and Trigeminy were present. Difficulty discerning atrial activity making definitive diagnosis difficult to ascertain.   Patient triggered events (2) associated with normal sinus rhythm  Assessment and Plan     #) Mobitz 1 #) advanced conduction system disease  Patient referred to EP for further evaluation of his advanced conduction system disease. Rhythm strip today with both P-P variability, R-R variability, with periods of wenkebach suggesting both a sinus node dysfunction and AV node dysfunction Recent zio monitor with chronotropic competence Narrow escape at 88ms Long discussion with patient today regarding indication for PPM and risks/benefits of procedure. Discussed that given he is asymptomatic of his conduction system abnormalities, do not recommend a PPM at this time. We discussed the progressive nature of conduction system and that he may need a PPM in the future.  ER precautions reviewed Avoid AVN  blocking medications  #) parox Afib #) h/o CVA #) Hypercoag d/t afib No Afib on recent zio monitor CHA2DS2-VASc Score = at least 5 [CHF History: 0, HTN History: 1, Diabetes History: 0, Stroke History: 2, Vascular Disease History: 0, Age Score: 2, Gender Score: 0].  Therefore, the patient's annual risk of stroke is 7.2 %.    Stroke ppx - 5mg  eliquis  BID, appropriately dosed No bleeding concerns    Discussed with Dr. Cindie   Current medicines are reviewed at length with the patient today.   The patient does not have concerns regarding his medicines.  The following changes were made today:  none  Labs/ tests ordered today include:  Orders Placed This Encounter  Procedures   EKG 12-Lead     Disposition: Follow up with EP Team or EP APP PRN    Signed, Chantal Needle, NP  05/05/24  4:05 PM  Electrophysiology [facility]

## 2024-05-05 ENCOUNTER — Encounter: Payer: Self-pay | Admitting: Cardiology

## 2024-05-05 ENCOUNTER — Ambulatory Visit (INDEPENDENT_AMBULATORY_CARE_PROVIDER_SITE_OTHER): Admitting: Cardiology

## 2024-05-05 ENCOUNTER — Ambulatory Visit: Admitting: Nurse Practitioner

## 2024-05-05 VITALS — BP 138/56 | HR 42 | Ht 70.0 in | Wt 180.2 lb

## 2024-05-05 DIAGNOSIS — I441 Atrioventricular block, second degree: Secondary | ICD-10-CM

## 2024-05-05 DIAGNOSIS — I480 Paroxysmal atrial fibrillation: Secondary | ICD-10-CM | POA: Diagnosis not present

## 2024-05-05 DIAGNOSIS — D6869 Other thrombophilia: Secondary | ICD-10-CM

## 2024-05-05 DIAGNOSIS — I495 Sick sinus syndrome: Secondary | ICD-10-CM | POA: Diagnosis not present

## 2024-05-05 DIAGNOSIS — I4589 Other specified conduction disorders: Secondary | ICD-10-CM | POA: Diagnosis not present

## 2024-05-05 NOTE — Patient Instructions (Signed)
 Medication Instructions:  Your physician recommends that you continue on your current medications as directed. Please refer to the Current Medication list given to you today.  *If you need a refill on your cardiac medications before your next appointment, please call your pharmacy*  Lab Work: No labs ordered today    Testing/Procedures: No test ordered today   Follow-Up: At [facility], you and your health needs are our priority.  As part of our continuing mission to provide you with exceptional heart care, our providers are all part of one team.  This team includes your primary Cardiologist (physician) and Advanced Practice Providers or APPs (Physician Assistants and Nurse Practitioners) who all work together to provide you with the care you need, when you need it.  Your next appointment:   As needed with EP and regular Cardiology follow ups with Dr. Gollan.   Provider:   Suzann Riddle, NP

## 2024-05-10 ENCOUNTER — Telehealth: Payer: Self-pay | Admitting: Nurse Practitioner

## 2024-05-10 NOTE — Telephone Encounter (Signed)
 Pt returning call to discuss Echo results. Please advise.

## 2024-05-10 NOTE — Telephone Encounter (Signed)
 The patient's wife has been notified of the result along with recommendations. Pt's wife verbalized understanding. All questions (if any) were answered        Lonni Tanda Meager, NP 05/05/2024  7:42 AM EST     Normal heart squeezing function with moderate stiffness of the heart muscle.  The mitral and aortic valves are mildly leaky.  The aortic valve is also moderately stenosed/tight.  Measurements of the aortic valve are slightly worse compared to last year though overall in a stable, moderate range.  Overall reassuring.  We will continue to follow annual echocardiograms.
